# Patient Record
Sex: Male | Born: 1960
Health system: Southern US, Community
[De-identification: ages and names within clinical notes are randomized; demographics above are authoritative.]

## PROBLEM LIST (undated history)

## (undated) DIAGNOSIS — Z8614 Personal history of Methicillin resistant Staphylococcus aureus infection: Secondary | ICD-10-CM

## (undated) DIAGNOSIS — I739 Peripheral vascular disease, unspecified: Secondary | ICD-10-CM

## (undated) DIAGNOSIS — J189 Pneumonia, unspecified organism: Secondary | ICD-10-CM

## (undated) DIAGNOSIS — I10 Essential (primary) hypertension: Secondary | ICD-10-CM

## (undated) DIAGNOSIS — M1990 Unspecified osteoarthritis, unspecified site: Secondary | ICD-10-CM

## (undated) DIAGNOSIS — K219 Gastro-esophageal reflux disease without esophagitis: Secondary | ICD-10-CM

## (undated) HISTORY — PX: WRIST SURGERY: SHX841

## (undated) HISTORY — PX: FOOT SURGERY: SHX648

---

## 1986-08-09 HISTORY — PX: HAND FUSION: SHX975

## 2002-08-09 DIAGNOSIS — Z8614 Personal history of Methicillin resistant Staphylococcus aureus infection: Secondary | ICD-10-CM

## 2002-08-09 HISTORY — DX: Personal history of Methicillin resistant Staphylococcus aureus infection: Z86.14

## 2014-05-18 ENCOUNTER — Emergency Department (HOSPITAL_COMMUNITY): Payer: No Typology Code available for payment source

## 2014-05-18 ENCOUNTER — Encounter (HOSPITAL_COMMUNITY): Payer: Self-pay | Admitting: Emergency Medicine

## 2014-05-18 ENCOUNTER — Emergency Department (HOSPITAL_COMMUNITY)
Admission: EM | Admit: 2014-05-18 | Discharge: 2014-05-18 | Disposition: A | Discharge status: Home / Self Care | Payer: No Typology Code available for payment source | Attending: Emergency Medicine | Admitting: Emergency Medicine

## 2014-05-18 DIAGNOSIS — S39012A Strain of muscle, fascia and tendon of lower back, initial encounter: Secondary | ICD-10-CM | POA: Diagnosis not present

## 2014-05-18 DIAGNOSIS — S3982XA Other specified injuries of lower back, initial encounter: Secondary | ICD-10-CM | POA: Diagnosis present

## 2014-05-18 DIAGNOSIS — M87059 Idiopathic aseptic necrosis of unspecified femur: Secondary | ICD-10-CM | POA: Diagnosis not present

## 2014-05-18 DIAGNOSIS — Z79899 Other long term (current) drug therapy: Secondary | ICD-10-CM | POA: Diagnosis not present

## 2014-05-18 DIAGNOSIS — R197 Diarrhea, unspecified: Secondary | ICD-10-CM | POA: Diagnosis not present

## 2014-05-18 DIAGNOSIS — Y9389 Activity, other specified: Secondary | ICD-10-CM | POA: Insufficient documentation

## 2014-05-18 DIAGNOSIS — S3991XA Unspecified injury of abdomen, initial encounter: Secondary | ICD-10-CM | POA: Diagnosis not present

## 2014-05-18 DIAGNOSIS — Y92410 Unspecified street and highway as the place of occurrence of the external cause: Secondary | ICD-10-CM | POA: Diagnosis not present

## 2014-05-18 DIAGNOSIS — Z720 Tobacco use: Secondary | ICD-10-CM | POA: Diagnosis not present

## 2014-05-18 DIAGNOSIS — M87052 Idiopathic aseptic necrosis of left femur: Secondary | ICD-10-CM

## 2014-05-18 DIAGNOSIS — R109 Unspecified abdominal pain: Secondary | ICD-10-CM

## 2014-05-18 DIAGNOSIS — R10811 Right upper quadrant abdominal tenderness: Secondary | ICD-10-CM | POA: Insufficient documentation

## 2014-05-18 LAB — CBC WITH DIFFERENTIAL/PLATELET
Basophils Absolute: 0 10*3/uL (ref 0.0–0.1)
Basophils Relative: 0 % (ref 0–1)
Eosinophils Absolute: 0 10*3/uL (ref 0.0–0.7)
Eosinophils Relative: 0 % (ref 0–5)
HCT: 41 % (ref 39.0–52.0)
Hemoglobin: 14.2 g/dL (ref 13.0–17.0)
Lymphocytes Relative: 16 % (ref 12–46)
Lymphs Abs: 1.1 10*3/uL (ref 0.7–4.0)
MCH: 30.8 pg (ref 26.0–34.0)
MCHC: 34.6 g/dL (ref 30.0–36.0)
MCV: 88.9 fL (ref 78.0–100.0)
Monocytes Absolute: 0.7 10*3/uL (ref 0.1–1.0)
Monocytes Relative: 10 % (ref 3–12)
Neutro Abs: 5.1 10*3/uL (ref 1.7–7.7)
Neutrophils Relative %: 74 % (ref 43–77)
Platelets: 196 10*3/uL (ref 150–400)
RBC: 4.61 MIL/uL (ref 4.22–5.81)
RDW: 14.1 % (ref 11.5–15.5)
WBC: 6.9 10*3/uL (ref 4.0–10.5)

## 2014-05-18 LAB — BASIC METABOLIC PANEL
Anion gap: 14 (ref 5–15)
BUN: 11 mg/dL (ref 6–23)
CO2: 25 mEq/L (ref 19–32)
Calcium: 9.4 mg/dL (ref 8.4–10.5)
Chloride: 98 mEq/L (ref 96–112)
Creatinine, Ser: 0.95 mg/dL (ref 0.50–1.35)
GFR calc Af Amer: >90 mL/min (ref >90)
GFR calc non Af Amer: >90 mL/min (ref >90)
Glucose, Bld: 112 mg/dL — ABNORMAL HIGH (ref 70–99)
Potassium: 4.1 mEq/L (ref 3.7–5.3)
Sodium: 137 mEq/L (ref 137–147)

## 2014-05-18 MED ORDER — HYDROMORPHONE HCL 1 MG/ML IJ SOLN
1.0000 mg | Freq: Once | INTRAMUSCULAR | Status: AC
  Administered 2014-05-18: 1 mg via INTRAVENOUS
  Filled 2014-05-18: qty 1

## 2014-05-18 MED ORDER — HYDROCODONE-ACETAMINOPHEN 5-325 MG PO TABS: 1.0000 | ORAL_TABLET | Freq: Four times a day (QID) | ORAL | Status: DC | PRN

## 2014-05-18 MED ORDER — ONDANSETRON HCL 4 MG/2ML IJ SOLN
4.0000 mg | Freq: Once | INTRAMUSCULAR | Status: AC
  Administered 2014-05-18: 4 mg via INTRAVENOUS
  Filled 2014-05-18: qty 2

## 2014-05-18 MED ORDER — IOHEXOL 300 MG/ML  SOLN
100.0000 mL | Freq: Once | INTRAMUSCULAR | Status: AC | PRN
  Administered 2014-05-18: 100 mL via INTRAVENOUS

## 2014-05-18 MED ORDER — ONDANSETRON 4 MG PO TBDP: 4.0000 mg | ORAL_TABLET | Freq: Three times a day (TID) | ORAL | Status: DC | PRN

## 2014-05-18 MED ORDER — SODIUM CHLORIDE 0.9 % IV BOLUS (SEPSIS)
250.0000 mL | Freq: Once | INTRAVENOUS | Status: AC
  Administered 2014-05-18: 250 mL via INTRAVENOUS

## 2014-05-18 MED ORDER — SODIUM CHLORIDE 0.9 % IV SOLN
INTRAVENOUS | Status: DC
  Administered 2014-05-18: 100 mL/h via INTRAVENOUS
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
## 2014-05-18 NOTE — ED Notes (Signed)
Patient transported to CT 
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
## 2014-05-31 ENCOUNTER — Other Ambulatory Visit (HOSPITAL_COMMUNITY): Payer: Self-pay | Admitting: Orthopaedic Surgery

## 2014-06-04 ENCOUNTER — Encounter (HOSPITAL_COMMUNITY)
Admission: RE | Admit: 2014-06-04 | Discharge: 2014-06-04 | Disposition: A | Discharge status: Home / Self Care | Payer: No Typology Code available for payment source | Source: Ambulatory Visit | Attending: Orthopaedic Surgery | Admitting: Orthopaedic Surgery

## 2014-06-04 ENCOUNTER — Encounter (HOSPITAL_COMMUNITY): Payer: Self-pay

## 2014-06-04 HISTORY — DX: Unspecified osteoarthritis, unspecified site: M19.90

## 2014-06-04 HISTORY — DX: Personal history of Methicillin resistant Staphylococcus aureus infection: Z86.14

## 2014-06-04 LAB — URINE MICROSCOPIC-ADD ON

## 2014-06-04 LAB — CBC WITH DIFFERENTIAL/PLATELET
Basophils Absolute: 0 10*3/uL (ref 0.0–0.1)
Basophils Relative: 0 % (ref 0–1)
Eosinophils Absolute: 0.1 10*3/uL (ref 0.0–0.7)
Eosinophils Relative: 1 % (ref 0–5)
HCT: 47.7 % (ref 39.0–52.0)
Hemoglobin: 16 g/dL (ref 13.0–17.0)
Lymphocytes Relative: 22 % (ref 12–46)
Lymphs Abs: 1.5 10*3/uL (ref 0.7–4.0)
MCH: 30.9 pg (ref 26.0–34.0)
MCHC: 33.5 g/dL (ref 30.0–36.0)
MCV: 92.3 fL (ref 78.0–100.0)
Monocytes Absolute: 0.7 10*3/uL (ref 0.1–1.0)
Monocytes Relative: 10 % (ref 3–12)
Neutro Abs: 4.4 10*3/uL (ref 1.7–7.7)
Neutrophils Relative %: 67 % (ref 43–77)
Platelets: 297 10*3/uL (ref 150–400)
RBC: 5.17 MIL/uL (ref 4.22–5.81)
RDW: 14.2 % (ref 11.5–15.5)
WBC: 6.6 10*3/uL (ref 4.0–10.5)

## 2014-06-04 LAB — TYPE AND SCREEN
ABO/RH(D): O POS
Antibody Screen: NEGATIVE

## 2014-06-04 LAB — SURGICAL PCR SCREEN
MRSA, PCR: NEGATIVE
Staphylococcus aureus: NEGATIVE

## 2014-06-04 LAB — URINALYSIS, ROUTINE W REFLEX MICROSCOPIC
Bilirubin Urine: NEGATIVE
Glucose, UA: NEGATIVE mg/dL
Hgb urine dipstick: NEGATIVE
Ketones, ur: 15 mg/dL — AB
Nitrite: NEGATIVE
Protein, ur: NEGATIVE mg/dL
Specific Gravity, Urine: 1.03 (ref 1.005–1.030)
Urobilinogen, UA: 0.2 mg/dL (ref 0.0–1.0)
pH: 6.5 (ref 5.0–8.0)

## 2014-06-04 LAB — COMPREHENSIVE METABOLIC PANEL
ALT: 31 U/L (ref 0–53)
AST: 31 U/L (ref 0–37)
Albumin: 4.2 g/dL (ref 3.5–5.2)
Alkaline Phosphatase: 86 U/L (ref 39–117)
Anion gap: 16 — ABNORMAL HIGH (ref 5–15)
BUN: 11 mg/dL (ref 6–23)
CO2: 23 mEq/L (ref 19–32)
Calcium: 10 mg/dL (ref 8.4–10.5)
Chloride: 102 mEq/L (ref 96–112)
Creatinine, Ser: 0.84 mg/dL (ref 0.50–1.35)
GFR calc Af Amer: >90 mL/min (ref >90)
GFR calc non Af Amer: >90 mL/min (ref >90)
Glucose, Bld: 115 mg/dL — ABNORMAL HIGH (ref 70–99)
Potassium: 4.3 mEq/L (ref 3.7–5.3)
Sodium: 141 mEq/L (ref 137–147)
Total Bilirubin: 0.4 mg/dL (ref 0.3–1.2)
Total Protein: 8 g/dL (ref 6.0–8.3)

## 2014-06-04 LAB — PROTIME-INR
INR: 1.02 (ref 0.00–1.49)
Prothrombin Time: 13.5 seconds (ref 11.6–15.2)

## 2014-06-04 LAB — APTT: aPTT: 32 s (ref 24–37)

## 2014-06-04 LAB — ABO/RH: ABO/RH(D): O POS

## 2014-06-04 NOTE — Progress Notes (Signed)
Pt. States he doesn't have high blood pressure. Notified Dollene PrimroseAllison Zelanek, PA of blood pressure/pulse. Stated to get ekg. Pt. States last ekg was 2 yrs. Ago at Dr. Lanell MatarAronson's office.  Gave pt.  Emmi  Program  so he could watch it at home. Refused to watch it here.

## 2014-06-04 NOTE — Pre-Procedure Instructions (Signed)
Nathaniel [name]  06/04/2014   Your procedure is scheduled on:  Monday, Nov. 2  [facility] AM.  Call this number if you have problems the morning of surgery: [phone]                           Call this number if you have any questions prior to your surgery date [phone]  Remember:   Do not eat food or drink liquids after midnight.   Take these medicines the morning of surgery with A SIP OF WATER: wellbutrin, hydrocodone if needed, zofran if needed               Stop taking aspirin, vitamins, and herbal medications. Do not take any NSAIDS ie: ibuprofen, advil, naproxen or any medication containing aspirin: stop 5 days prior to procedure (thursday Oct. 29)                Do not wear jewelry, make-up or nail polish.  Do not wear lotions, powders, or perfumes. You may wear deodorant.  Do not shave 48 hours prior to surgery. Men may shave face and neck.  Do not bring valuables to the hospital.  [facility] is not responsible for any belongings or valuables.               Contacts, dentures or bridgework may not be worn into surgery.  Leave suitcase in the car. After surgery it may be brought to your room.  For patients admitted to the hospital, discharge time is determined by your treatment team.               Patients discharged the day of surgery will not be allowed to drive home.  Name and phone number of your driver:   Special Instructions: review preparing for surgery  handout   Please read over the following fact sheets that you were given: Pain Booklet, Coughing and Deep Breathing, Blood Transfusion Information, MRSA Information and Surgical Site Infection Prevention

## 2014-06-04 NOTE — Progress Notes (Signed)
06/04/14 1247  OBSTRUCTIVE SLEEP APNEA  Have you ever been diagnosed with sleep apnea through a sleep study? No  Do you snore loudly (loud enough to be heard through closed doors)?  1  Do you often feel tired, fatigued, or sleepy during the daytime? 1  Has anyone observed you stop breathing during your sleep? 0  Do you have, or are you being treated for high blood pressure? 0  BMI more than 35 kg/m2? 0  Age over 51 years old? 1  Neck circumference greater than 40 cm/16 inches? 1  Gender: 1  Obstructive Sleep Apnea Score 5  Score 4 or greater  Results sent to PCP

## 2014-06-05 LAB — URINE CULTURE
Colony Count: NO GROWTH
Culture: NO GROWTH

## 2014-06-05 NOTE — Progress Notes (Signed)
Anesthesia Chart Review:  Pt is 53 year old male scheduled for L total hip arthroplasty on 06/10/2014 with Dr. Roda ShuttersXu  PMH: arthritis. Current smoker. BMI 34  Medications include: ibuprofen, zofran, wellbutrin, tramadol, vicodin  BP at PAT 160/99, pulse 104  Preoperative labs reviewed.    EKG: Normal sinus rhythm Cannot rule out Anterior infarct , age undetermined Appears unchanged from previous in 2011.  If no changes, I anticipate pt can proceed with surgery as scheduled.   Rica Mastngela Jair Lindblad, FNP-BC [facility]iology Phone: [phone] 06/05/2014 2:22 PM

## 2014-06-09 MED ORDER — TRANEXAMIC ACID 100 MG/ML IV SOLN
1000.0000 mg | INTRAVENOUS | Status: DC
  Filled 2014-06-09: qty 10

## 2014-06-09 MED ORDER — CEFAZOLIN SODIUM-DEXTROSE 2-3 GM-% IV SOLR
2.0000 g | INTRAVENOUS | Status: AC
  Administered 2014-06-10: 2 g via INTRAVENOUS
  Filled 2014-06-09: qty 50

## 2014-06-09 MED ORDER — CHLORHEXIDINE GLUCONATE 4 % EX LIQD
60.0000 mL | Freq: Once | CUTANEOUS | Status: DC
  Filled 2014-06-09: qty 60

## 2014-06-10 ENCOUNTER — Encounter (HOSPITAL_COMMUNITY)
Admission: RE | Disposition: A | Discharge status: Home / Self Care | Payer: Self-pay | Source: Ambulatory Visit | Attending: Orthopaedic Surgery

## 2014-06-10 ENCOUNTER — Encounter (HOSPITAL_COMMUNITY): Payer: Self-pay | Admitting: *Deleted

## 2014-06-10 ENCOUNTER — Inpatient Hospital Stay (HOSPITAL_COMMUNITY)
Admission: RE | Admit: 2014-06-10 | Discharge: 2014-06-12 | DRG: 470 | Disposition: A | Discharge status: Home / Self Care | Payer: No Typology Code available for payment source | Source: Ambulatory Visit | Attending: Orthopaedic Surgery | Admitting: Orthopaedic Surgery

## 2014-06-10 ENCOUNTER — Inpatient Hospital Stay (HOSPITAL_COMMUNITY): Payer: No Typology Code available for payment source

## 2014-06-10 ENCOUNTER — Inpatient Hospital Stay (HOSPITAL_COMMUNITY): Payer: No Typology Code available for payment source | Admitting: Vascular Surgery

## 2014-06-10 ENCOUNTER — Inpatient Hospital Stay (HOSPITAL_COMMUNITY): Payer: No Typology Code available for payment source | Admitting: Anesthesiology

## 2014-06-10 DIAGNOSIS — M87852 Other osteonecrosis, left femur: Principal | ICD-10-CM | POA: Diagnosis present

## 2014-06-10 DIAGNOSIS — Z419 Encounter for procedure for purposes other than remedying health state, unspecified: Secondary | ICD-10-CM

## 2014-06-10 DIAGNOSIS — M1612 Unilateral primary osteoarthritis, left hip: Secondary | ICD-10-CM | POA: Diagnosis present

## 2014-06-10 DIAGNOSIS — Z8614 Personal history of Methicillin resistant Staphylococcus aureus infection: Secondary | ICD-10-CM | POA: Diagnosis not present

## 2014-06-10 DIAGNOSIS — F17210 Nicotine dependence, cigarettes, uncomplicated: Secondary | ICD-10-CM | POA: Diagnosis present

## 2014-06-10 DIAGNOSIS — M879 Osteonecrosis, unspecified: Secondary | ICD-10-CM | POA: Diagnosis present

## 2014-06-10 DIAGNOSIS — Z9889 Other specified postprocedural states: Secondary | ICD-10-CM

## 2014-06-10 DIAGNOSIS — D62 Acute posthemorrhagic anemia: Secondary | ICD-10-CM | POA: Diagnosis not present

## 2014-06-10 DIAGNOSIS — Z96649 Presence of unspecified artificial hip joint: Secondary | ICD-10-CM

## 2014-06-10 HISTORY — PX: TOTAL HIP ARTHROPLASTY: SHX124

## 2014-06-10 SURGERY — ARTHROPLASTY, HIP, TOTAL, ANTERIOR APPROACH
Anesthesia: General | Laterality: Left

## 2014-06-10 MED ORDER — ACETAMINOPHEN 500 MG PO TABS
1000.0000 mg | ORAL_TABLET | Freq: Four times a day (QID) | ORAL | Status: AC
  Administered 2014-06-10 – 2014-06-11 (×4): 1000 mg via ORAL
  Filled 2014-06-10 (×6): qty 2

## 2014-06-10 MED ORDER — ONDANSETRON HCL 4 MG/2ML IJ SOLN
INTRAMUSCULAR | Status: AC
  Filled 2014-06-10: qty 2

## 2014-06-10 MED ORDER — HYDROMORPHONE HCL 1 MG/ML IJ SOLN
INTRAMUSCULAR | Status: AC
  Filled 2014-06-10: qty 1

## 2014-06-10 MED ORDER — OXYCODONE HCL ER 10 MG PO T12A
10.0000 mg | EXTENDED_RELEASE_TABLET | Freq: Two times a day (BID) | ORAL | Status: DC
  Administered 2014-06-10 – 2014-06-12 (×4): 10 mg via ORAL
  Filled 2014-06-10 (×6): qty 1

## 2014-06-10 MED ORDER — SODIUM CHLORIDE 0.9 % IV SOLN
INTRAVENOUS | Status: DC
  Administered 2014-06-10: 21:00:00 via INTRAVENOUS

## 2014-06-10 MED ORDER — HYDROMORPHONE HCL 1 MG/ML IJ SOLN
INTRAMUSCULAR | Status: AC
  Administered 2014-06-10: 0.5 mg via INTRAVENOUS
  Filled 2014-06-10: qty 1

## 2014-06-10 MED ORDER — LIDOCAINE HCL (CARDIAC) 20 MG/ML IV SOLN
INTRAVENOUS | Status: DC | PRN
  Administered 2014-06-10: 100 mg via INTRAVENOUS

## 2014-06-10 MED ORDER — PHENOL 1.4 % MT LIQD: 1.0000 | OROMUCOSAL | Status: DC | PRN

## 2014-06-10 MED ORDER — ONDANSETRON HCL 4 MG/2ML IJ SOLN: 4.0000 mg | Freq: Once | INTRAMUSCULAR | Status: DC | PRN

## 2014-06-10 MED ORDER — KETOROLAC TROMETHAMINE 30 MG/ML IJ SOLN
30.0000 mg | Freq: Four times a day (QID) | INTRAMUSCULAR | Status: DC | PRN
  Administered 2014-06-10: 30 mg via INTRAVENOUS

## 2014-06-10 MED ORDER — LABETALOL HCL 5 MG/ML IV SOLN
INTRAVENOUS | Status: DC | PRN
  Administered 2014-06-10 (×3): 5 mg via INTRAVENOUS

## 2014-06-10 MED ORDER — ACETAMINOPHEN 325 MG PO TABS: 650.0000 mg | ORAL_TABLET | Freq: Four times a day (QID) | ORAL | Status: DC | PRN

## 2014-06-10 MED ORDER — MEPERIDINE HCL 25 MG/ML IJ SOLN: 6.2500 mg | INTRAMUSCULAR | Status: DC | PRN

## 2014-06-10 MED ORDER — DEXTROSE 5 % IV SOLN
500.0000 mg | Freq: Four times a day (QID) | INTRAVENOUS | Status: DC | PRN
  Filled 2014-06-10: qty 5

## 2014-06-10 MED ORDER — ROCURONIUM BROMIDE 50 MG/5ML IV SOLN
INTRAVENOUS | Status: AC
  Filled 2014-06-10: qty 2

## 2014-06-10 MED ORDER — CEFAZOLIN SODIUM-DEXTROSE 2-3 GM-% IV SOLR
2.0000 g | Freq: Four times a day (QID) | INTRAVENOUS | Status: AC
  Administered 2014-06-10 – 2014-06-11 (×2): 2 g via INTRAVENOUS
  Filled 2014-06-10 (×2): qty 50

## 2014-06-10 MED ORDER — OXYCODONE HCL 5 MG PO TABS: 5.0000 mg | ORAL_TABLET | ORAL | Status: DC | PRN

## 2014-06-10 MED ORDER — OXYCODONE HCL 5 MG/5ML PO SOLN: 5.0000 mg | Freq: Once | ORAL | Status: DC | PRN

## 2014-06-10 MED ORDER — SODIUM CHLORIDE 0.9 % IR SOLN
Status: DC | PRN
  Administered 2014-06-10: 1000 mL

## 2014-06-10 MED ORDER — OXYCODONE HCL 5 MG PO TABS
5.0000 mg | ORAL_TABLET | ORAL | Status: DC | PRN
  Administered 2014-06-10 – 2014-06-11 (×5): 10 mg via ORAL
  Filled 2014-06-10 (×4): qty 2

## 2014-06-10 MED ORDER — FENTANYL CITRATE 0.05 MG/ML IJ SOLN
INTRAMUSCULAR | Status: DC | PRN
  Administered 2014-06-10: 50 ug via INTRAVENOUS
  Administered 2014-06-10: 100 ug via INTRAVENOUS
  Administered 2014-06-10: 50 ug via INTRAVENOUS
  Administered 2014-06-10 (×2): 100 ug via INTRAVENOUS
  Administered 2014-06-10 (×2): 50 ug via INTRAVENOUS

## 2014-06-10 MED ORDER — KETOROLAC TROMETHAMINE 30 MG/ML IJ SOLN
INTRAMUSCULAR | Status: AC
  Filled 2014-06-10: qty 1

## 2014-06-10 MED ORDER — DIPHENHYDRAMINE HCL 12.5 MG/5ML PO ELIX: 25.0000 mg | ORAL_SOLUTION | ORAL | Status: DC | PRN

## 2014-06-10 MED ORDER — OXYCODONE HCL 5 MG PO TABS
ORAL_TABLET | ORAL | Status: AC
  Filled 2014-06-10: qty 2

## 2014-06-10 MED ORDER — NEOSTIGMINE METHYLSULFATE 10 MG/10ML IV SOLN
INTRAVENOUS | Status: DC | PRN
  Administered 2014-06-10: 2 mg via INTRAVENOUS
  Administered 2014-06-10: 3 mg via INTRAVENOUS

## 2014-06-10 MED ORDER — BUPROPION HCL ER (XL) 150 MG PO TB24
150.0000 mg | ORAL_TABLET | Freq: Every day | ORAL | Status: DC
  Administered 2014-06-10 – 2014-06-11 (×2): 150 mg via ORAL
  Filled 2014-06-10 (×3): qty 1

## 2014-06-10 MED ORDER — SENNOSIDES-DOCUSATE SODIUM 8.6-50 MG PO TABS: 1.0000 | ORAL_TABLET | Freq: Every evening | ORAL | Status: DC | PRN

## 2014-06-10 MED ORDER — METOCLOPRAMIDE HCL 10 MG PO TABS: 5.0000 mg | ORAL_TABLET | Freq: Three times a day (TID) | ORAL | Status: DC | PRN

## 2014-06-10 MED ORDER — METHOCARBAMOL 500 MG PO TABS
ORAL_TABLET | ORAL | Status: AC
  Administered 2014-06-10: 500 mg via ORAL
  Filled 2014-06-10: qty 1

## 2014-06-10 MED ORDER — ACETAMINOPHEN 650 MG RE SUPP: 650.0000 mg | Freq: Four times a day (QID) | RECTAL | Status: DC | PRN

## 2014-06-10 MED ORDER — ALUM & MAG HYDROXIDE-SIMETH 200-200-20 MG/5ML PO SUSP
30.0000 mL | ORAL | Status: DC | PRN
  Administered 2014-06-10: 30 mL via ORAL
  Filled 2014-06-10: qty 30

## 2014-06-10 MED ORDER — TRANEXAMIC ACID 100 MG/ML IV SOLN
1000.0000 mg | INTRAVENOUS | Status: AC
  Administered 2014-06-10: 1000 mg via INTRAVENOUS
  Filled 2014-06-10: qty 10

## 2014-06-10 MED ORDER — MIDAZOLAM HCL 5 MG/5ML IJ SOLN
INTRAMUSCULAR | Status: DC | PRN
  Administered 2014-06-10 (×2): 1 mg via INTRAVENOUS

## 2014-06-10 MED ORDER — 0.9 % SODIUM CHLORIDE (POUR BTL) OPTIME
TOPICAL | Status: DC | PRN
  Administered 2014-06-10: 1000 mL

## 2014-06-10 MED ORDER — ROCURONIUM BROMIDE 100 MG/10ML IV SOLN
INTRAVENOUS | Status: DC | PRN
  Administered 2014-06-10: 10 mg via INTRAVENOUS
  Administered 2014-06-10: 20 mg via INTRAVENOUS
  Administered 2014-06-10: 50 mg via INTRAVENOUS

## 2014-06-10 MED ORDER — DEXTROSE 5 % IV SOLN
INTRAVENOUS | Status: DC | PRN
  Administered 2014-06-10: 13:00:00 via INTRAVENOUS

## 2014-06-10 MED ORDER — OXYCODONE HCL ER 10 MG PO T12A: 10.0000 mg | EXTENDED_RELEASE_TABLET | Freq: Two times a day (BID) | ORAL | Status: DC

## 2014-06-10 MED ORDER — ARTIFICIAL TEARS OP OINT
TOPICAL_OINTMENT | OPHTHALMIC | Status: AC
  Filled 2014-06-10: qty 3.5

## 2014-06-10 MED ORDER — GLYCOPYRROLATE 0.2 MG/ML IJ SOLN
INTRAMUSCULAR | Status: AC
  Filled 2014-06-10: qty 4

## 2014-06-10 MED ORDER — OXYCODONE HCL 5 MG PO TABS: 5.0000 mg | ORAL_TABLET | Freq: Once | ORAL | Status: DC | PRN

## 2014-06-10 MED ORDER — METOCLOPRAMIDE HCL 5 MG/ML IJ SOLN: 5.0000 mg | Freq: Three times a day (TID) | INTRAMUSCULAR | Status: DC | PRN

## 2014-06-10 MED ORDER — HYDROMORPHONE HCL 1 MG/ML IJ SOLN
0.2500 mg | INTRAMUSCULAR | Status: DC | PRN
  Administered 2014-06-10 (×6): 0.5 mg via INTRAVENOUS

## 2014-06-10 MED ORDER — GLYCOPYRROLATE 0.2 MG/ML IJ SOLN
INTRAMUSCULAR | Status: DC | PRN
Start: 2014-06-10 — End: 2014-06-10
  Administered 2014-06-10 (×2): 0.4 mg via INTRAVENOUS

## 2014-06-10 MED ORDER — LIDOCAINE HCL (CARDIAC) 20 MG/ML IV SOLN
INTRAVENOUS | Status: AC
  Filled 2014-06-10: qty 5

## 2014-06-10 MED ORDER — ONDANSETRON HCL 4 MG PO TABS: 4.0000 mg | ORAL_TABLET | Freq: Four times a day (QID) | ORAL | Status: DC | PRN

## 2014-06-10 MED ORDER — CELECOXIB 200 MG PO CAPS
200.0000 mg | ORAL_CAPSULE | Freq: Two times a day (BID) | ORAL | Status: DC
  Administered 2014-06-10 – 2014-06-11 (×3): 200 mg via ORAL
  Filled 2014-06-10 (×5): qty 1

## 2014-06-10 MED ORDER — MIDAZOLAM HCL 2 MG/2ML IJ SOLN
INTRAMUSCULAR | Status: AC
  Filled 2014-06-10: qty 2

## 2014-06-10 MED ORDER — LACTATED RINGERS IV SOLN
INTRAVENOUS | Status: DC | PRN
  Administered 2014-06-10 (×3): via INTRAVENOUS

## 2014-06-10 MED ORDER — ONDANSETRON HCL 4 MG/2ML IJ SOLN: 4.0000 mg | Freq: Four times a day (QID) | INTRAMUSCULAR | Status: DC | PRN

## 2014-06-10 MED ORDER — METHOCARBAMOL 500 MG PO TABS
500.0000 mg | ORAL_TABLET | Freq: Four times a day (QID) | ORAL | Status: DC | PRN
  Administered 2014-06-10: 500 mg via ORAL

## 2014-06-10 MED ORDER — ONDANSETRON HCL 4 MG/2ML IJ SOLN
INTRAMUSCULAR | Status: DC | PRN
  Administered 2014-06-10: 4 mg via INTRAVENOUS

## 2014-06-10 MED ORDER — ASPIRIN EC 325 MG PO TBEC
325.0000 mg | DELAYED_RELEASE_TABLET | Freq: Two times a day (BID) | ORAL | Status: DC
  Administered 2014-06-10 – 2014-06-12 (×4): 325 mg via ORAL
  Filled 2014-06-10 (×7): qty 1

## 2014-06-10 MED ORDER — HYDROMORPHONE HCL 1 MG/ML IJ SOLN: 0.2500 mg | INTRAMUSCULAR | Status: DC | PRN

## 2014-06-10 MED ORDER — PROPOFOL 10 MG/ML IV BOLUS
INTRAVENOUS | Status: DC | PRN
  Administered 2014-06-10: 160 mg via INTRAVENOUS
  Administered 2014-06-10: 30 mg via INTRAVENOUS

## 2014-06-10 MED ORDER — ASPIRIN EC 325 MG PO TBEC: 325.0000 mg | DELAYED_RELEASE_TABLET | Freq: Two times a day (BID) | ORAL | Status: DC

## 2014-06-10 MED ORDER — ALBUMIN HUMAN 5 % IV SOLN
INTRAVENOUS | Status: DC | PRN
  Administered 2014-06-10: 15:00:00 via INTRAVENOUS

## 2014-06-10 MED ORDER — SENNA 8.6 MG PO TABS
1.0000 | ORAL_TABLET | Freq: Two times a day (BID) | ORAL | Status: DC
  Administered 2014-06-10 – 2014-06-11 (×3): 8.6 mg via ORAL
  Filled 2014-06-10 (×5): qty 1

## 2014-06-10 MED ORDER — POLYETHYLENE GLYCOL 3350 17 G PO PACK: 17.0000 g | PACK | Freq: Every day | ORAL | Status: DC | PRN

## 2014-06-10 MED ORDER — MAGNESIUM CITRATE PO SOLN: 1.0000 | Freq: Once | ORAL | Status: AC | PRN

## 2014-06-10 MED ORDER — SORBITOL 70 % SOLN: 30.0000 mL | Freq: Every day | Status: DC | PRN

## 2014-06-10 MED ORDER — PROMETHAZINE HCL 25 MG PO TABS: 25.0000 mg | ORAL_TABLET | Freq: Four times a day (QID) | ORAL | Status: DC | PRN

## 2014-06-10 MED ORDER — MENTHOL 3 MG MT LOZG: 1.0000 | LOZENGE | OROMUCOSAL | Status: DC | PRN

## 2014-06-10 MED ORDER — PROPOFOL 10 MG/ML IV BOLUS
INTRAVENOUS | Status: AC
  Filled 2014-06-10: qty 20

## 2014-06-10 MED ORDER — FENTANYL CITRATE 0.05 MG/ML IJ SOLN
INTRAMUSCULAR | Status: AC
  Filled 2014-06-10: qty 5

## 2014-06-10 MED ORDER — MORPHINE SULFATE 2 MG/ML IJ SOLN
2.0000 mg | INTRAMUSCULAR | Status: DC | PRN
  Administered 2014-06-10: 2 mg via INTRAVENOUS
  Filled 2014-06-10: qty 1

## 2014-06-10 MED ORDER — LACTATED RINGERS IV SOLN
INTRAVENOUS | Status: DC
  Administered 2014-06-10: 11:00:00 via INTRAVENOUS

## 2014-06-10 SURGICAL SUPPLY — 51 items
BIT DRILL FLEXIBLE 35 (BIT) ×1
BIT DRILL FLEXIBLE 35MM (BIT) ×1 IMPLANT
BLADE SAW SGTL 18X1.27X75 (BLADE) ×2 IMPLANT
CELLS DAT CNTRL 66122 CELL SVR (MISCELLANEOUS) ×2 IMPLANT
DERMABOND ADVANCED (GAUZE/BANDAGES/DRESSINGS)
DERMABOND ADVANCED .7 DNX12 (GAUZE/BANDAGES/DRESSINGS) IMPLANT
DRAPE C-ARM 42X72 X-RAY (DRAPES) ×2 IMPLANT
DRAPE IMP U-DRAPE 54X76 (DRAPES) ×2 IMPLANT
DRAPE STERI IOBAN 125X83 (DRAPES) ×2 IMPLANT
DRAPE SURG 17X11 SM STRL (DRAPES) ×4 IMPLANT
DRAPE U-SHAPE 47X51 STRL (DRAPES) ×6 IMPLANT
DRILL BIT FLEXIBLE 35MM (BIT) ×1
DRSG AQUACEL AG ADV 3.5X10 (GAUZE/BANDAGES/DRESSINGS) ×2 IMPLANT
DURAPREP 26ML APPLICATOR (WOUND CARE) ×2 IMPLANT
ELECT BLADE 4.0 EZ CLEAN MEGAD (MISCELLANEOUS) ×2
ELECT BLADE TIP CTD 4 INCH (ELECTRODE) IMPLANT
ELECT REM PT RETURN 9FT ADLT (ELECTROSURGICAL) ×2
ELECTRODE BLDE 4.0 EZ CLN MEGD (MISCELLANEOUS) ×1 IMPLANT
ELECTRODE REM PT RTRN 9FT ADLT (ELECTROSURGICAL) ×1 IMPLANT
FACESHIELD WRAPAROUND (MASK) ×4 IMPLANT
GAUZE XEROFORM 5X9 LF (GAUZE/BANDAGES/DRESSINGS) ×2 IMPLANT
GLOVE BIO SURGEON STRL SZ 6.5 (GLOVE) ×2 IMPLANT
GLOVE BIOGEL PI IND STRL 6 (GLOVE) ×1 IMPLANT
GLOVE BIOGEL PI IND STRL 6.5 (GLOVE) ×2 IMPLANT
GLOVE BIOGEL PI INDICATOR 6 (GLOVE) ×1
GLOVE BIOGEL PI INDICATOR 6.5 (GLOVE) ×2
GLOVE ECLIPSE 7.0 STRL STRAW (GLOVE) ×6 IMPLANT
GLOVE SURG SYN 7.5  E (GLOVE) ×2
GLOVE SURG SYN 7.5 E (GLOVE) ×2 IMPLANT
GOWN STRL REUS W/TWL XL LVL3 (GOWN DISPOSABLE) ×4 IMPLANT
HANDPIECE INTERPULSE COAX TIP (DISPOSABLE) ×1
HIP FOROUS FEM W/OX HD RS XLPE ×2 IMPLANT
KIT BASIN OR (CUSTOM PROCEDURE TRAY) ×2 IMPLANT
PACK TOTAL JOINT (CUSTOM PROCEDURE TRAY) ×2 IMPLANT
PACK UNIVERSAL I (CUSTOM PROCEDURE TRAY) ×2 IMPLANT
PADDING CAST COTTON 6X4 STRL (CAST SUPPLIES) IMPLANT
PEN SKIN MARKING BROAD (MISCELLANEOUS) ×2 IMPLANT
RTRCTR WOUND ALEXIS 18CM MED (MISCELLANEOUS) ×4
SET HNDPC FAN SPRY TIP SCT (DISPOSABLE) ×1 IMPLANT
STRIP CLOSURE SKIN 1/2X4 (GAUZE/BANDAGES/DRESSINGS) IMPLANT
SUT MNCRL AB 3-0 PS2 18 (SUTURE) ×2 IMPLANT
SUT VIC AB 0 CT1 27 (SUTURE) ×1
SUT VIC AB 0 CT1 27XBRD ANBCTR (SUTURE) ×1 IMPLANT
SUT VIC AB 1 CT1 27 (SUTURE) ×1
SUT VIC AB 1 CT1 27XBRD ANBCTR (SUTURE) ×1 IMPLANT
SUT VIC AB 2-0 CT1 27 (SUTURE) ×2
SUT VIC AB 2-0 CT1 TAPERPNT 27 (SUTURE) ×2 IMPLANT
TOWEL OR 17X26 10 PK STRL BLUE (TOWEL DISPOSABLE) ×4 IMPLANT
TOWEL OR NON WOVEN STRL DISP B (DISPOSABLE) IMPLANT
TRAY FOLEY CATH 16FR SILVER (SET/KITS/TRAYS/PACK) ×2 IMPLANT
TUBE SUCT ARGYLE STRL (TUBING) ×2 IMPLANT

## 2014-06-10 NOTE — Anesthesia Procedure Notes (Signed)
Procedure Name: Intubation Date/Time: 06/10/2014 12:39 PM Performed by: Darcey NoraJAMES, Anjelina Dung B Pre-anesthesia Checklist: Patient identified, Emergency Drugs available, Suction available and Patient being monitored Patient Re-evaluated:Patient Re-evaluated prior to inductionOxygen Delivery Method: Circle system utilized Preoxygenation: Pre-oxygenation with 100% oxygen Intubation Type: IV induction Ventilation: Mask ventilation without difficulty Laryngoscope Size: Mac and 4 Grade View: Grade II Tube type: Oral Tube size: 7.5 mm Number of attempts: 1 Airway Equipment and Method: Stylet Placement Confirmation: ETT inserted through vocal cords under direct vision,  breath sounds checked- equal and bilateral and positive ETCO2 Secured at: 21 (cm at teeth) cm Tube secured with: Tape Dental Injury: Teeth and Oropharynx as per pre-operative assessment

## 2014-06-10 NOTE — Progress Notes (Signed)
Arthritis left hip

## 2014-06-10 NOTE — Anesthesia Preprocedure Evaluation (Addendum)
Anesthesia Evaluation  Patient identified by MRN, date of birth, ID band Patient awake    Reviewed: Allergy & Precautions, H&P , NPO status , Patient's Chart, lab work & pertinent test results  Airway Mallampati: II  TM Distance: >3 FB Neck ROM: Full    Dental  (+) Teeth Intact, Dental Advisory Given   Pulmonary Current Smoker,  E-cigarettes and cigars         Cardiovascular     Neuro/Psych    GI/Hepatic   Endo/Other    Renal/GU      Musculoskeletal   Abdominal   Peds  Hematology   Anesthesia Other Findings Beard  Reproductive/Obstetrics                          Anesthesia Physical Anesthesia Plan  ASA: II  Anesthesia Plan: General   Post-op Pain Management:    Induction: Intravenous  Airway Management Planned: Oral ETT  Additional Equipment:   Intra-op Plan:   Post-operative Plan: Extubation in OR  Informed Consent: I have reviewed the patients History and Physical, chart, labs and discussed the procedure including the risks, benefits and alternatives for the proposed anesthesia with the patient or authorized representative who has indicated his/her understanding and acceptance.     Plan Discussed with: CRNA and Surgeon  Anesthesia Plan Comments:        Anesthesia Quick Evaluation

## 2014-06-10 NOTE — Progress Notes (Signed)
Foley catheter

## 2014-06-10 NOTE — Anesthesia Postprocedure Evaluation (Signed)
Anesthesia Post Note  Patient: Nathaniel [name]  Procedure(s) Performed: Procedure(s) (LRB): LEFT TOTAL HIP ARTHROPLASTY ANTERIOR APPROACH (Left)  Anesthesia type: general  Patient location: PACU  Post pain: Pain level controlled  Post assessment: Patient's Cardiovascular Status Stable  Last Vitals:  Filed Vitals:   06/10/14 1700  BP: 136/89  Pulse: 81  Temp: 36.3 C  Resp: 18    Post vital signs: Reviewed and stable  Level of consciousness: sedated  Complications: No apparent anesthesia complications

## 2014-06-10 NOTE — H&P (Signed)
PREOPERATIVE H&P  Chief Complaint: Left hip osteoarthritis, avascular necrosis  HPI: Nathaniel [name] is a 53 y.o. male who presents for surgical treatment of Left hip osteoarthritis, avascular necrosis.  He denies any changes in medical history.  Past Medical History  Diagnosis Date  . Arthritis   . Hx MRSA infection 2004    in the  foot   Past Surgical History  Procedure Laterality Date  . Foot surgery    . Wrist surgery    . Hand fusion Right 1988  . Foot surgery Right     has a steele rod   History   Social History  . Marital Status: Single    Spouse Name: N/A    Number of Children: N/A  . Years of Education: N/A   Social History Main Topics  . Smoking status: Current Every Day Smoker -- 20 years    Types: E-cigarettes  . Smokeless tobacco: Not on file  . Alcohol Use: No  . Drug Use: No  . Sexual Activity: Not on file   Other Topics Concern  . Not on file   Social History Narrative  . No narrative on file   No family history on file. No Known Allergies Prior to Admission medications   Medication Sig Start Date End Date Taking? Authorizing Provider  Ascorbic Acid (VITAMIN C PO) Take 1 tablet by mouth every morning.   Yes Historical Provider, MD  buPROPion (WELLBUTRIN XL) 150 MG 24 hr tablet Take 150 mg by mouth daily.  05/01/14  Yes Historical Provider, MD  Cholecalciferol (VITAMIN D PO) Take 1 capsule by mouth every morning.   Yes Historical Provider, MD  Cyanocobalamin (VITAMIN B 12 PO) Take 1 tablet by mouth every morning.   Yes Historical Provider, MD  HYDROcodone-acetaminophen (NORCO/VICODIN) 5-325 MG per tablet Take 1-2 tablets by mouth every 6 (six) hours as needed for moderate pain. 05/18/14  Yes Vanetta MuldersScott Zackowski, MD  ibuprofen (ADVIL,MOTRIN) 200 MG tablet Take 600 mg by mouth every 6 (six) hours as needed.   Yes Historical Provider, MD  MAGNESIUM PO Take 1 tablet by mouth every morning.   Yes Historical Provider, MD  ondansetron (ZOFRAN ODT) 4 MG  disintegrating tablet Take 1 tablet (4 mg total) by mouth every 8 (eight) hours as needed for nausea or vomiting. 05/18/14  Yes Vanetta MuldersScott Zackowski, MD  OVER THE COUNTER MEDICATION Place 1 application into the nose daily as needed (Nasal congestion). Essential oils   Yes Historical Provider, MD  peppermint oil liquid Take 1 application by mouth daily. Apply once to tongue each day   Yes Historical Provider, MD  traMADol (ULTRAM) 50 MG tablet Take 50 mg by mouth every 8 (eight) hours as needed.    Historical Provider, MD     Positive ROS: All other systems have been reviewed and were otherwise negative with the exception of those mentioned in the HPI and as above.  Physical Exam: General: Alert, no acute distress Cardiovascular: No pedal edema Respiratory: No cyanosis, no use of accessory musculature GI: abdomen soft Skin: No lesions in the area of chief complaint Neurologic: Sensation intact distally Psychiatric: Patient is competent for consent with normal mood and affect Lymphatic: no lymphedema  MUSCULOSKELETAL:  - no skin lesions - NVI LLE  Assessment: Left hip osteoarthritis, avascular necrosis  Plan: Plan for Procedure(s): LEFT TOTAL HIP ARTHROPLASTY ANTERIOR APPROACH  The risks benefits and alternatives were discussed with the patient including but not limited to the risks of nonoperative treatment, versus surgical  intervention including infection, bleeding, nerve injury,  blood clots, cardiopulmonary complications, morbidity, mortality, among others, and they were willing to proceed.   Cheral AlmasXu, Robertine Kipper Michael, MD   06/10/2014 8:27 AM

## 2014-06-10 NOTE — Progress Notes (Signed)
Able to feel pedal pulse very slightly in left foot

## 2014-06-10 NOTE — Transfer of Care (Signed)
Immediate Anesthesia Transfer of Care Note  Patient: Nathaniel [name]  Procedure(s) Performed: Procedure(s): LEFT TOTAL HIP ARTHROPLASTY ANTERIOR APPROACH (Left)  Patient Location: PACU  Anesthesia Type:General  Level of Consciousness: awake, alert  and patient cooperative  Airway & Oxygen Therapy: Patient Spontanous Breathing and Patient connected to nasal cannula oxygen  Post-op Assessment: Report given to PACU RN, Post -op Vital signs reviewed and stable and Patient moving all extremities  Post vital signs: Reviewed and stable  Complications: No apparent anesthesia complications

## 2014-06-10 NOTE — Discharge Instructions (Signed)
1. Remove dressing on 06/17/2014.  2. Begin betadine paints to incision twice a day with dry dressing. 3. May get incision wet while showering after staples have been removed.  Do not submerge incision until notified. 4. WBAT 5. Ice the operative 20 minutes on and 20 minutes off around the clock.  6. Avoid any dental visits including dental cleanings for 6 months after surgery.

## 2014-06-10 NOTE — Progress Notes (Signed)
Over the bed trapeze ordered

## 2014-06-10 NOTE — Op Note (Addendum)
LEFT TOTAL HIP ARTHROPLASTY ANTERIOR APPROACH  Procedure Note Franne FortsJames Schwer   [id number]  Pre-op Diagnosis: Left hip osteoarthritis, avascular necrosis     Post-op Diagnosis: same   Operative Procedures  1. Total hip replacement; Left hip; uncemented [id number]   Personnel  Surgeon(s): Zachory Mangual Glee ArvinMichael Faust Thorington, MD   Assistant: Maud DeedSheila Vernon, PA-C (necessary for the timely completion of the procedure)   Anesthesia: spinal, epidural   Prosthesis: Smith and Nephew Acetabulum: R3 Femur: Anthology 11 HO Head: 36 mm size: +8 Bearing Type: Oxinium on poly  Date of Service: 06/10/2014  Total Hip Arthroplasty (Anterior Approach) Op Note:  After informed consent was obtained and the operative extremity marked in the holding area, the patient was brought back to the operating room and placed supine on the HANA table. Next, the operative extremity was prepped and draped in normal sterile fashion. Surgical timeout occurred verifying patient identification, surgical site, surgical procedure and administration of antibiotics.  A modified anterior Smith-Peterson approach to the hip was performed, using the interval between tensor fascia lata and sartorius.  Dissection was carried bluntly down onto the anterior hip capsule. The lateral femoral circumflex vessels were identified and coagulated. A capsulotomy was performed and the capsular flaps tagged for later repair.  Fluoroscopy was utilized to prepare for the femoral neck cut. The neck osteotomy was performed. The femoral head was removed, the acetabular rim was cleared of soft tissue and attention was turned to reaming the acetabulum.  Sequential reaming was performed under fluoroscopic guidance. We reamed to a size 58 mm, and then impacted the acetabular shell. One 30 mm cancellous screw was placed through the cup.  The liner was then placed after irrigation and attention turned to the femur.  After placing the femoral hook, the leg was taken to externally  rotated, extended and adducted position taking care to perform soft tissue releases to allow for adequate mobilization of the femur. Soft tissue was cleared from the shoulder of the greater trochanter and the hook elevator used to improve exposure of the proximal femur. Sequential broaching performed up to a size 11. Trial neck and head were placed. The leg was brought back up to neutral and the construct reduced. The position and sizing of components, offset and leg lengths were checked using fluoroscopy. Stability of the  construct was checked in extension and external rotation without any subluxation or impingement of prosthesis. We dislocated the prosthesis, dropped the leg back into position, removed trial components, and irrigated copiously. The final stem and head was then placed, the leg brought back up, the system reduced and fluoroscopy used to verify positioning.  We irrigated, obtained hemostasis and closed the capsule using #5 ethibond suture.  Dilute betadyne solution was used. The fascia was closed with #1 vicryl plus, the deep fat layer was closed with 0 vicryl, the subcutaneous layers closed with 2.0 Vicryl Plus and the skin closed with staples. A sterile dressing was applied. The patient was awakened in the operating room and taken to recovery in stable condition. All sponge, needle, and instrument counts were correct at the end of the  case.   Position: supine  Complications: none.  Time Out: performed   Drains/Packing: none  Estimated blood loss: 300 cc  Returned to Recovery Room: in good condition.   Antibiotics: yes   Mechanical VTE (DVT) Prophylaxis: sequential compression devices, TED thigh-high  Chemical VTE (DVT) Prophylaxis: aspirin (other pharmacologic prophylaxis not  indicated - bleeding risk)   Fluid Replacement  Crystalloid:  2000 cc Blood: none  FFP: none   Specimens Removed: 1 to pathology   Sponge and Instrument Count Correct? yes   PACU: portable  radiograph - low AP   Admission: inpatient status, start PT & OT POD#1  Plan/RTC: Return in 2 weeks for staple removal. Return in 6 weeks to see MD.  Weight Bearing/Load Lower Extremity: full  Hip precautions: none Suture Removal: 10-14 days  Betadine to incision twice daily once dressing is removed on POD#7  N. Glee ArvinMichael Kinser Fellman, MD [facility] [phone] 3:27 PM      Implant Name Type Inv. Item Serial No. Manufacturer Lot No. LRB No. Used  30MM REFLECTION SPHERICAL HEAL 6.5MM CANCELLOUS Orthopedic Implant   [facility] [id number] Left 1  LINER OD 36X58MM - [id number] Liner LINER OD 36X58MM  [employer] [id number] Left 1  SHELL USE W/LINR OD 58MM - [id number] Shell SHELL USE W/LINR OD 58MM  [employer] [id number] Left 1  36 MM L/ +8 12/14 TAPER FEMORAL HEAD Orthopedic Implant   [employer] [id number] Left 1  HIGH OFFSET ANTHOLOGY POROUS PLUS HA FEMORAL COMPONENT Orthopedic Implant     [employer] [id number] Left 1

## 2014-06-11 LAB — CBC
HCT: 34.9 % — ABNORMAL LOW (ref 39.0–52.0)
Hemoglobin: 11.7 g/dL — ABNORMAL LOW (ref 13.0–17.0)
MCH: 30.3 pg (ref 26.0–34.0)
MCHC: 33.5 g/dL (ref 30.0–36.0)
MCV: 90.4 fL (ref 78.0–100.0)
Platelets: 202 10*3/uL (ref 150–400)
RBC: 3.86 MIL/uL — ABNORMAL LOW (ref 4.22–5.81)
RDW: 14.1 % (ref 11.5–15.5)
WBC: 7.6 10*3/uL (ref 4.0–10.5)

## 2014-06-11 LAB — BASIC METABOLIC PANEL
Anion gap: 14 (ref 5–15)
BUN: 11 mg/dL (ref 6–23)
CO2: 24 mEq/L (ref 19–32)
Calcium: 8.6 mg/dL (ref 8.4–10.5)
Chloride: 100 mEq/L (ref 96–112)
Creatinine, Ser: 0.93 mg/dL (ref 0.50–1.35)
GFR calc Af Amer: >90 mL/min (ref >90)
GFR calc non Af Amer: >90 mL/min (ref >90)
Glucose, Bld: 112 mg/dL — ABNORMAL HIGH (ref 70–99)
Potassium: 3.7 mEq/L (ref 3.7–5.3)
Sodium: 138 mEq/L (ref 137–147)

## 2014-06-11 NOTE — Plan of Care (Signed)
Problem: Consults Goal: Diagnosis- Total Joint Replacement Primary Total Hip Left  Problem: Phase I Progression Outcomes Goal: CMS/Neurovascular status WDL Outcome: Completed/Met Date Met:  06/11/14 Goal: Hemodynamically stable Outcome: Completed/Met Date Met:  06/11/14

## 2014-06-11 NOTE — Care Management Note (Signed)
CARE MANAGEMENT NOTE 06/11/2014  Patient:  Franne FortsXUM,Nijee   Account Number:  [id number]  Date Initiated:  06/11/2014  Documentation initiated by:  Vance PeperBRADY,Bristyn Kulesza  Subjective/Objective Assessment:   53 yr old male admitted with left hip osteoarthritis. Patient had a left total hip arthroplasty.     Action/Plan:   Case manager spooke with patient concerning home health and DME needs at discharge. Choice offered. [place] [facility]. Patient has RW, states he doesnt need 3in1. Has family support at discharge.   Anticipated DC Date:  06/12/2014   Anticipated DC Plan:  HOME W HOME HEALTH SERVICES      DC Planning Services  CM consult      [facility]   Choice offered to / List presented to:  C-1 Patient   DME arranged  NA        HH arranged  HH-2 PT      HH agency  [facility]   Status of service:  Completed, signed off Medicare Important Message given?   (If response is "NO", the following Medicare IM given date fields will be blank) Date Medicare IM given:   Medicare IM given by:   Date Additional Medicare IM given:   Additional Medicare IM given by:    Discharge Disposition:  HOME W HOME HEALTH SERVICES  Per UR Regulation:  Reviewed for med. necessity/level of care/duration of stay  If discussed at Long Length of Stay Meetings, dates discussed:    Comments:

## 2014-06-11 NOTE — Progress Notes (Signed)
Utilization review completed.  
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
## 2014-06-11 NOTE — Progress Notes (Signed)
   Subjective:  Patient reports pain as moderate.  No events.  Stood at bedside earlier today.  Objective:   VITALS:   Filed Vitals:   06/10/14 1745 06/10/14 2002 06/11/14 0142 06/11/14 0601  BP: 119/69 132/81 125/66 113/77  Pulse: 81 79 89 86  Temp: 98.6 F (37 C) 98.5 F (36.9 C) 98.7 F (37.1 C) 98.1 F (36.7 C)  TempSrc:  Oral Oral   Resp: 18     Weight:      SpO2: 97% 92% 96% 97%    Neurologically intact Neurovascular intact Sensation intact distally Intact pulses distally Dorsiflexion/Plantar flexion intact Incision: dressing C/D/I and no drainage No cellulitis present Compartment soft   Lab Results  Component Value Date   WBC 7.6 06/11/2014   HGB 11.7* 06/11/2014   HCT 34.9* 06/11/2014   MCV 90.4 06/11/2014   PLT 202 06/11/2014     Assessment/Plan:  1 Day Post-Op   - Expected postop acute blood loss anemia - will monitor for symptoms - Up with PT/OT - DVT ppx - SCDs, ambulation, asa - WBAT left lower extremity - foley out today - Pain control - Discharge planning  Cheral AlmasXu, Naiping Michael 06/11/2014, 7:58 AM [phone]
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
## 2014-06-12 LAB — CBC
HCT: 36.2 % — ABNORMAL LOW (ref 39.0–52.0)
Hemoglobin: 12.1 g/dL — ABNORMAL LOW (ref 13.0–17.0)
MCH: 30.7 pg (ref 26.0–34.0)
MCHC: 33.4 g/dL (ref 30.0–36.0)
MCV: 91.9 fL (ref 78.0–100.0)
Platelets: 214 10*3/uL (ref 150–400)
RBC: 3.94 MIL/uL — ABNORMAL LOW (ref 4.22–5.81)
RDW: 13.9 % (ref 11.5–15.5)
WBC: 7.4 10*3/uL (ref 4.0–10.5)

## 2014-06-12 NOTE — Plan of Care (Signed)
Problem: Phase I Progression Outcomes Goal: Pain controlled with appropriate interventions Outcome: Completed/Met Date Met:  06/12/14     

## 2014-06-12 NOTE — Progress Notes (Signed)
   Subjective:  Patient reports no events.  Objective:   VITALS:   Filed Vitals:   06/11/14 0601 06/11/14 1558 06/11/14 2008 06/12/14 0557  BP: 113/77 145/87 127/72 141/95  Pulse: 86 88 80 111  Temp: 98.1 F (36.7 C) 98.9 F (37.2 C) 99.2 F (37.3 C) 98.2 F (36.8 C)  TempSrc:  Oral Oral Oral  Resp:  18 17 20   Weight:      SpO2: 97% 97% 97% 99%    Incision c/d/i No signs of infection   Lab Results  Component Value Date   WBC 7.4 06/12/2014   HGB 12.1* 06/12/2014   HCT 36.2* 06/12/2014   MCV 91.9 06/12/2014   PLT 214 06/12/2014     Assessment/Plan:  2 Days Post-Op   - Expected postop acute blood loss anemia - will monitor for symptoms - dc home today - Rx in chart  Cheral AlmasXu, Cylas Falzone Michael 06/12/2014, 7:57 AM [phone]

## 2014-06-12 NOTE — Plan of Care (Signed)
Problem: Phase II Progression Outcomes Goal: Tolerating diet Outcome: Completed/Met Date Met:  06/12/14

## 2014-06-12 NOTE — Plan of Care (Signed)
Problem: Phase II Progression Outcomes Goal: Ambulates Outcome: Completed/Met Date Met:  06/12/14

## 2014-06-12 NOTE — Progress Notes (Signed)
Physical Therapy Treatment Patient Details Name: Nathaniel [name] MRN: [id number] DOB: [birth year] Today's Date: 06/12/2014    History of Present Illness s/p L THA anterior approach; No precautions    PT Comments    Overall managing quite well, including a flight of steps with cane and rail; OK for dc home from PT standpoint   Follow Up Recommendations  Home health PT;Supervision - Intermittent     Equipment Recommendations  3in1 (PT) (Pt already owns RW; Consider BSC; will defer to OT)    Recommendations for Other Services       Precautions / Restrictions Precautions Precaution Comments: Anterior approach; no precautions Restrictions Weight Bearing Restrictions: Yes LLE Weight Bearing: Weight bearing as tolerated    Mobility  Bed Mobility                  Transfers Overall transfer level: Needs assistance Equipment used: Rolling walker (2 wheeled) Transfers: Sit to/from Stand Sit to Stand: Supervision         General transfer comment: cues for technique and hand placement for stand to sit. Pt able to perform sit to stand without use of RW. Qutie effortful with stand to sit  Ambulation/Gait Ambulation/Gait assistance: Supervision;Modified independent (Device/Increase time) Ambulation Distance (Feet): 200 Feet Assistive device: Rolling walker (2 wheeled) Gait Pattern/deviations: Step-through pattern Gait velocity: decreased   General Gait Details: Cues mostly for posture and to self-monitor for activity tolerance   Stairs Stairs: Yes Stairs assistance: Supervision Stair Management: One rail Right;With cane;Forwards;Step to pattern Number of Stairs: 12 General stair comments: Cues for first using two hands on one rail, then to use cane and rail; managing quite well with cane and rail  Wheelchair Mobility    Modified Rankin (Stroke Patients Only)       Balance     Sitting balance-Leahy Scale: Good       Standing balance-Leahy Scale: Fair                       Cognition Arousal/Alertness: Awake/alert Behavior During Therapy: WFL for tasks assessed/performed Overall Cognitive Status: Within Functional Limits for tasks assessed                      Exercises      General Comments        Pertinent Vitals/Pain Pain Assessment: 0-10 Pain Score: 5  Pain Location: L hip Pain Descriptors / Indicators: Aching;Burning Pain Intervention(s): Ice applied;Monitored during session    Home Living                      Prior Function            PT Goals (current goals can now be found in the care plan section) Acute Rehab PT Goals Patient Stated Goal: get home PT Goal Formulation: With patient Potential to Achieve Goals: Good Progress towards PT goals: Progressing toward goals    Frequency  7X/week    PT Plan Current plan remains appropriate    Co-evaluation             End of Session Equipment Utilized During Treatment: Gait belt Activity Tolerance: Patient tolerated treatment well Patient left: with call bell/phone within reach;in bed     Time: 0825-0845 PT Time Calculation (min): 20 min  Charges:  $Gait Training: 8-22 mins                    G Codes:  [name], Nathaniel [name] [facility]amff 06/12/2014, 10:23 AM  Van ClinesHolly Janey [name], PT  Acute Rehabilitation Services Pager [phone] Office [phone]

## 2014-06-13 ENCOUNTER — Encounter (HOSPITAL_COMMUNITY): Payer: Self-pay | Admitting: Orthopaedic Surgery
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
## 2016-12-09 ENCOUNTER — Other Ambulatory Visit: Payer: Self-pay | Admitting: Internal Medicine

## 2016-12-09 ENCOUNTER — Ambulatory Visit
Admission: RE | Admit: 2016-12-09 | Discharge: 2016-12-09 | Disposition: A | Discharge status: Home / Self Care | Payer: 59 | Source: Ambulatory Visit | Attending: Internal Medicine | Admitting: Internal Medicine

## 2016-12-09 DIAGNOSIS — Z720 Tobacco use: Secondary | ICD-10-CM

## 2016-12-23 ENCOUNTER — Encounter (INDEPENDENT_AMBULATORY_CARE_PROVIDER_SITE_OTHER): Payer: Self-pay | Admitting: Physician Assistant

## 2016-12-23 ENCOUNTER — Ambulatory Visit (INDEPENDENT_AMBULATORY_CARE_PROVIDER_SITE_OTHER): Payer: 59

## 2016-12-23 ENCOUNTER — Ambulatory Visit (INDEPENDENT_AMBULATORY_CARE_PROVIDER_SITE_OTHER): Payer: 59 | Admitting: Physician Assistant

## 2016-12-23 VITALS — Ht 74.0 in | Wt 263.0 lb

## 2016-12-23 DIAGNOSIS — M25571 Pain in right ankle and joints of right foot: Secondary | ICD-10-CM

## 2016-12-23 DIAGNOSIS — M25551 Pain in right hip: Secondary | ICD-10-CM

## 2016-12-23 MED ORDER — LIDOCAINE HCL 1 % IJ SOLN
3.0000 mL | INTRAMUSCULAR | Status: AC | PRN
  Administered 2016-12-23: 3 mL

## 2016-12-23 MED ORDER — METHYLPREDNISOLONE ACETATE 40 MG/ML IJ SUSP
40.0000 mg | INTRAMUSCULAR | Status: AC | PRN
  Administered 2016-12-23: 40 mg via INTRA_ARTICULAR
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
## 2016-12-29 ENCOUNTER — Ambulatory Visit (INDEPENDENT_AMBULATORY_CARE_PROVIDER_SITE_OTHER): Payer: 59

## 2016-12-29 ENCOUNTER — Ambulatory Visit (INDEPENDENT_AMBULATORY_CARE_PROVIDER_SITE_OTHER): Payer: 59 | Admitting: Physical Medicine and Rehabilitation

## 2016-12-29 ENCOUNTER — Encounter (INDEPENDENT_AMBULATORY_CARE_PROVIDER_SITE_OTHER): Payer: Self-pay | Admitting: Physical Medicine and Rehabilitation

## 2016-12-29 DIAGNOSIS — M25551 Pain in right hip: Secondary | ICD-10-CM | POA: Diagnosis not present

## 2016-12-29 MED ORDER — BUPIVACAINE HCL 0.5 % IJ SOLN
3.0000 mL | INTRAMUSCULAR | Status: AC | PRN
  Administered 2016-12-29: 3 mL via INTRA_ARTICULAR

## 2016-12-29 MED ORDER — TRIAMCINOLONE ACETONIDE 40 MG/ML IJ SUSP
80.0000 mg | INTRAMUSCULAR | Status: AC | PRN
  Administered 2016-12-29: 80 mg via INTRA_ARTICULAR

## 2016-12-29 NOTE — Progress Notes (Deleted)
Right hip pain for about 3 years which radiates from groin to buttock. Hip pain is worse with standing and walking.

## 2016-12-29 NOTE — Patient Instructions (Signed)

## 2016-12-29 NOTE — Progress Notes (Signed)
Nathaniel [name] - 56 y.o. male MRN [id number]  Date of birth: [birth year]  Office Visit Note: Visit Date: 12/29/2016 PCP: Nathaniel NobleGates, Robert, MD Referred by: Nathaniel NobleGates, Robert, MD  Subjective: Chief Complaint  Patient presents with  . Right Hip - Pain   HPI: Nathaniel [name] is a 56 year old gentleman with right hip and groin pain has been going on for several years and just worsening progressively. This pain radiates into the groin and buttock region. Is worse with standing and walking and moving. He denies left-sided complaints. Nathaniel [name] requests a diagnostic and therapeutic anesthetic hip arthrogram.    ROS Otherwise per HPI.  Assessment & Plan: Visit Diagnoses:  1. Pain in right hip     Plan: Findings:  Diagnostic and therapeutic right anesthetic hip arthrogram. Patient did get relief in anesthetic portion of the injection.    Meds & Orders: No orders of the defined types were placed in this encounter.   Orders Placed This Encounter  Procedures  . Large Joint Injection/Arthrocentesis  . XR C-ARM NO REPORT    Follow-up: Return if symptoms worsen or fail to improve, for Nathaniel [name].   Procedures: Hip anesthetic arthrogram Date/Time: 12/29/2016 9:30 AM Performed by: Nathaniel [name] Authorized by: Nathaniel [name]   Consent Given by:  Patient Site marked: the procedure site was marked   Timeout: prior to procedure the correct patient, procedure, and site was verified   Indications:  Pain and diagnostic evaluation Location:  Hip Prep: patient was prepped and draped in usual sterile fashion   Needle Size:  22 G Approach:  Anterior Ultrasound Guidance: No   Fluoroscopic Guidance: No   Arthrogram: Yes   Medications:  3 mL bupivacaine 0.5 %; 80 mg triamcinolone acetonide 40 MG/ML Aspiration Attempted: Yes   Patient tolerance:  Patient tolerated the procedure well with no immediate complications  Arthrogram demonstrated excellent flow of contrast throughout the joint surface without  extravasation or obvious defect.  The patient had relief of symptoms during the anesthetic phase of the injection.      No notes on file   Clinical History: No specialty comments available.  He reports that he has been smoking E-cigarettes.  He has smoked for the past 20.00 years. He has never used smokeless tobacco. No results for input(s): HGBA1C, LABURIC in the last 8760 hours.  Objective:  VS:  HT:    WT:   BMI:     BP:   HR: bpm  TEMP: ( )  RESP:  Physical Exam  Musculoskeletal:  Patient is with a cane. He has an ankle brace on the right ankle. He walks with an antalgic gait.    Ortho Exam Imaging: Xr C-arm No Report  Result Date: 12/29/2016 Please see Notes or Procedures tab for imaging impression.   Past Medical/Family/Surgical/Social History: Medications & Allergies reviewed per EMR Patient Active Problem List   Diagnosis Date Noted  . Osteonecrosis of left hip (HCC) 06/10/2014   Past Medical History:  Diagnosis Date  . Arthritis   . Hx MRSA infection 2004   in the  foot   History reviewed. No pertinent family history. Past Surgical History:  Procedure Laterality Date  . FOOT SURGERY    . FOOT SURGERY Right    has a steele rod  . HAND FUSION Right 1988  . TOTAL HIP ARTHROPLASTY Left 06/10/2014   Procedure: LEFT TOTAL HIP ARTHROPLASTY ANTERIOR APPROACH;  Surgeon: Nathaniel AlmasNaiping Michael Xu, MD;  Location: [facility] OR;  Service: Orthopedics;  Laterality: Left;  .  WRIST SURGERY     Social History   Occupational History  . Not on file.   Social History Main Topics  . Smoking status: Current Every Day Smoker    Years: 20.00    Types: E-cigarettes  . Smokeless tobacco: Never Used  . Alcohol use No  . Drug use: No  . Sexual activity: Not on file

## 2017-02-02 ENCOUNTER — Ambulatory Visit (INDEPENDENT_AMBULATORY_CARE_PROVIDER_SITE_OTHER): Payer: 59 | Admitting: Orthopaedic Surgery

## 2017-02-02 DIAGNOSIS — M19171 Post-traumatic osteoarthritis, right ankle and foot: Secondary | ICD-10-CM | POA: Diagnosis not present
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
## 2017-02-07 ENCOUNTER — Encounter (INDEPENDENT_AMBULATORY_CARE_PROVIDER_SITE_OTHER): Payer: Self-pay | Admitting: Orthopaedic Surgery

## 2017-02-07 ENCOUNTER — Ambulatory Visit (INDEPENDENT_AMBULATORY_CARE_PROVIDER_SITE_OTHER): Payer: 59 | Admitting: Orthopaedic Surgery

## 2017-02-07 DIAGNOSIS — M19171 Post-traumatic osteoarthritis, right ankle and foot: Secondary | ICD-10-CM
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
## 2017-03-23 ENCOUNTER — Encounter (INDEPENDENT_AMBULATORY_CARE_PROVIDER_SITE_OTHER): Payer: Self-pay | Admitting: Physician Assistant

## 2017-03-23 ENCOUNTER — Ambulatory Visit (INDEPENDENT_AMBULATORY_CARE_PROVIDER_SITE_OTHER): Payer: 59 | Admitting: Physician Assistant

## 2017-03-23 VITALS — Ht 75.0 in | Wt 265.0 lb

## 2017-03-23 DIAGNOSIS — M19171 Post-traumatic osteoarthritis, right ankle and foot: Secondary | ICD-10-CM

## 2017-03-23 MED ORDER — LIDOCAINE HCL 1 % IJ SOLN
3.0000 mL | INTRAMUSCULAR | Status: AC | PRN
  Administered 2017-03-23: 3 mL

## 2017-03-23 MED ORDER — METHYLPREDNISOLONE ACETATE 40 MG/ML IJ SUSP
40.0000 mg | INTRAMUSCULAR | Status: AC | PRN
  Administered 2017-03-23: 40 mg via INTRA_ARTICULAR

## 2017-03-23 NOTE — Progress Notes (Signed)
   Procedure Note  Patient: Nathaniel [name]             Date of Birth: October 02, 1960           MRN: [id number]             Visit Date: 03/23/2017 History of present illness: Nathaniel [name] 56 year old male well known to Dr. Magnus IvanBlackman services comes in today due to right ankle pain. Again he has posttraumatic arthritis the right ankle. He was seen back in May this year and received a cortisone injection in the ankle and did well until recently. He is requesting a cortisone injection today. He's had no new injuries. Denies any change in medications or any new health issues.   Right ankle: No abnormal warmth or erythema. Decreased dorsiflexion plantar flexion ankle.  Procedures: Visit Diagnoses: No diagnosis found.  Medium Joint Inj Date/Time: 03/23/2017 2:11 PM Performed by: Kirtland BouchardLARK, Flonnie Wierman W Authorized by: Kirtland BouchardLARK, Macey Wurtz W   Location:  Ankle Needle Size:  22 G Needle Length:  1.5 inches Approach:  Anterolateral Ultrasound Guided: No   Fluoroscopic Guidance: No   Medications:  3 mL lidocaine 1 %; 40 mg methylPREDNISolone acetate 40 MG/ML Aspiration Attempted: No     Plan: We'll see him back on an as-needed basis for his ankle. He understands he can only have injections every 3 months

## 2017-09-01 ENCOUNTER — Ambulatory Visit (INDEPENDENT_AMBULATORY_CARE_PROVIDER_SITE_OTHER): Payer: PRIVATE HEALTH INSURANCE | Admitting: Orthopaedic Surgery

## 2017-09-01 ENCOUNTER — Encounter (INDEPENDENT_AMBULATORY_CARE_PROVIDER_SITE_OTHER): Payer: Self-pay | Admitting: Orthopaedic Surgery

## 2017-09-01 ENCOUNTER — Ambulatory Visit (INDEPENDENT_AMBULATORY_CARE_PROVIDER_SITE_OTHER): Payer: PRIVATE HEALTH INSURANCE

## 2017-09-01 DIAGNOSIS — M1611 Unilateral primary osteoarthritis, right hip: Secondary | ICD-10-CM | POA: Diagnosis not present

## 2017-09-01 NOTE — Progress Notes (Signed)
   Office Visit Note   Patient: Nathaniel [name]           Date of Birth: 02-Dec-1960           MRN: [id number] Visit Date: 09/01/2017              Requested by: Nathaniel [name], Robert, MD [address] [place], [place] [address] PCP: Nathaniel [name], Robert, MD   Assessment & Plan: Visit Diagnoses:  1. Primary localized osteoarthritis of right hip     Plan: Impression is right hip degenerative joint disease with osteonecrosis likely due to chronic alcohol use.  He wishes to undergo total hip replacement.  He is aware the risk benefits alternatives to surgery.  He had a good outcome with his left hip.  We will schedule him in the near future.  Follow-Up Instructions: Return for 10-14 days post-op right total hip replacement.   Orders:  Orders Placed This Encounter  Procedures  . XR HIP UNILAT W OR W/O PELVIS 2-3 VIEWS RIGHT   No orders of the defined types were placed in this encounter.     Procedures: No procedures performed   Clinical Data: No additional findings.   Subjective: Chief Complaint  Patient presents with  . Right Hip - Follow-up, Pain    Patient comes in today for severe right hip pain.  He is doing well from his left hip standpoint.  He is having significant difficulty with ADLs and pain.  He is limping.  He has no quality of life.    Review of Systems  Constitutional: Negative.   All other systems reviewed and are negative.    Objective: Vital Signs: There were no vitals taken for this visit.  Physical Exam  Constitutional: He is oriented to person, place, and time. He appears well-developed and well-nourished.  Pulmonary/Chest: Effort normal.  Abdominal: Soft.  Neurological: He is alert and oriented to person, place, and time.  Skin: Skin is warm.  Psychiatric: He has a normal mood and affect. His behavior is normal. Judgment and thought content normal.  Nursing note and vitals reviewed.   Ortho Exam Right hip exam shows significant pain with any  rotation.  Positive Stinchfield sign. Specialty Comments:  No specialty comments available.  Imaging: No results found.   PMFS History: Patient Active Problem List   Diagnosis Date Noted  . Primary localized osteoarthritis of right hip 09/01/2017  . Post-traumatic arthritis of right ankle 02/07/2017  . Osteonecrosis of left hip (HCC) 06/10/2014   Past Medical History:  Diagnosis Date  . Arthritis   . Hx MRSA infection 2004   in the  foot    History reviewed. No pertinent family history.  Past Surgical History:  Procedure Laterality Date  . FOOT SURGERY    . FOOT SURGERY Right    has a steele rod  . HAND FUSION Right 1988  . TOTAL HIP ARTHROPLASTY Left 06/10/2014   Procedure: LEFT TOTAL HIP ARTHROPLASTY ANTERIOR APPROACH;  Surgeon: Cheral AlmasNaiping Michael Jamesyn Lindell, MD;  Location: [facility] OR;  Service: Orthopedics;  Laterality: Left;  . WRIST SURGERY     Social History   Occupational History  . Not on file  Tobacco Use  . Smoking status: Current Every Day Smoker    Years: 20.00    Types: E-cigarettes  . Smokeless tobacco: Never Used  Substance and Sexual Activity  . Alcohol use: No  . Drug use: No  . Sexual activity: Not on file

## 2017-09-07 NOTE — Pre-Procedure Instructions (Signed)
Nathaniel [name]  09/07/2017      [facility] - [place] - [address] [place] [address] Phone: [phone] Fax: [phone]  [facility] - [place] - [address] [place] [address] Phone: [phone] Fax: [phone]    Your procedure is scheduled on February 6  [facility] at [employer] [employer] A.M.  Call this number if you have problems the morning of surgery:  [phone]   Remember:  Do not eat food or drink liquids after midnight.  Continue all medications as directed by your physician except follow these medication instructions before surgery below   Take these medicines the morning of surgery with A SIP OF WATER  amLODipine (NORVASC) omeprazole (PRILOSEC)  traMADol (ULTRAM)  7 days prior to surgery STOP taking any Aspirin(unless otherwise instructed by your surgeon), Aleve, Naproxen, Ibuprofen, Motrin, Advil, Goody's, BC's, all herbal medications, fish oil, and all vitamins    Do not wear jewelry  Do not wear lotions, powders, or cologne, or deodorant.  Men may shave face and neck.  Do not bring valuables to the hospital.  [facility] is not responsible for any belongings or valuables.  Contacts, dentures or bridgework may not be worn into surgery.  Leave your suitcase in the car.  After surgery it may be brought to your room.  For patients admitted to the hospital, discharge time will be determined by your treatment team.  Patients discharged the day of surgery will not be allowed to drive home.   Special instructions:   [place]- Preparing For Surgery  Before surgery, you can play an important role. Because skin is not sterile, your skin needs to be as free of germs as possible. You can reduce the number of germs on your skin by washing with CHG (chlorahexidine gluconate) Soap before surgery.  CHG is an antiseptic cleaner which kills germs  and bonds with the skin to continue killing germs even after washing.  Please do not use if you have an allergy to CHG or antibacterial soaps. If your skin becomes reddened/irritated stop using the CHG.  Do not shave (including legs and underarms) for at least 48 hours prior to first CHG shower. It is OK to shave your face.  Please follow these instructions carefully.   1. Shower the NIGHT BEFORE SURGERY and the MORNING OF SURGERY with CHG.   2. If you chose to wash your hair, wash your hair first as usual with your normal shampoo.  3. After you shampoo, rinse your hair and body thoroughly to remove the shampoo.  4. Use CHG as you would any other liquid soap. You can apply CHG directly to the skin and wash gently with a scrungie or a clean washcloth.   5. Apply the CHG Soap to your body ONLY FROM THE NECK DOWN.  Do not use on open wounds or open sores. Avoid contact with your eyes, ears, mouth and genitals (private parts). Wash Face and genitals (private parts)  with your normal soap.  6. Wash thoroughly, paying special attention to the area where your surgery will be performed.  7. Thoroughly rinse your body with warm water from the neck down.  8. DO NOT shower/wash with your normal soap after using and rinsing off the CHG Soap.  9. Pat yourself dry with a CLEAN TOWEL.  10. Wear CLEAN PAJAMAS to bed the night before surgery, wear  comfortable clothes the morning of surgery  11. Place CLEAN SHEETS on your bed the night of your first shower and DO NOT SLEEP WITH PETS.    Day of Surgery: Do not apply any deodorants/lotions. Please wear clean clothes to the hospital/surgery center.      Please read over the following fact sheets that you were given.

## 2017-09-08 ENCOUNTER — Inpatient Hospital Stay (HOSPITAL_COMMUNITY)
Admission: RE | Admit: 2017-09-08 | Discharge: 2017-09-08 | Disposition: A | Discharge status: Home / Self Care | Payer: 59 | Source: Ambulatory Visit

## 2017-09-13 ENCOUNTER — Telehealth (INDEPENDENT_AMBULATORY_CARE_PROVIDER_SITE_OTHER): Payer: Self-pay | Admitting: Orthopaedic Surgery

## 2017-09-13 MED ORDER — TRANEXAMIC ACID 1000 MG/10ML IV SOLN
2000.0000 mg | INTRAVENOUS | Status: DC
  Filled 2017-09-13: qty 20

## 2017-09-13 MED ORDER — DEXTROSE 5 % IV SOLN
3.0000 g | INTRAVENOUS | Status: DC
  Filled 2017-09-13: qty 3000

## 2017-09-13 NOTE — Telephone Encounter (Signed)
SEE MESSAGE BELOW:

## 2017-09-13 NOTE — Progress Notes (Signed)
Notified Sherri at Dr. Warren DanesXu's office that patient stated he wanted to cancel.

## 2017-09-13 NOTE — Telephone Encounter (Signed)
Patient called to let Dr Roda ShuttersXu know that he had to cancel his insurance because the insurance company would not cover his surgery. The number to contact patient is [phone]

## 2017-09-13 NOTE — Progress Notes (Signed)
Patient states due to insurance he is cancelling surgery.

## 2017-09-14 ENCOUNTER — Ambulatory Visit (HOSPITAL_COMMUNITY): Admission: RE | Admit: 2017-09-14 | Payer: 59 | Source: Ambulatory Visit | Admitting: Orthopaedic Surgery

## 2017-09-14 ENCOUNTER — Encounter (HOSPITAL_COMMUNITY): Admission: RE | Payer: Self-pay | Source: Ambulatory Visit

## 2017-09-14 SURGERY — ARTHROPLASTY, HIP, TOTAL, ANTERIOR APPROACH
Anesthesia: Spinal | Laterality: Right

## 2017-11-28 ENCOUNTER — Telehealth (INDEPENDENT_AMBULATORY_CARE_PROVIDER_SITE_OTHER): Payer: Self-pay | Admitting: Orthopaedic Surgery

## 2017-11-28 NOTE — Telephone Encounter (Signed)
Patient called stating that he is needing Dr. Roda ShuttersXu to write a letter to his lawyer stating what his health problems are so he can get his disability. CB # [phone]

## 2017-11-29 NOTE — Telephone Encounter (Signed)
Please advise 

## 2017-11-29 NOTE — Telephone Encounter (Signed)
Probably best to send this to dr.xu for when he gets back

## 2017-11-29 NOTE — Telephone Encounter (Signed)
Please advise. See message below.  

## 2017-12-02 NOTE — Telephone Encounter (Signed)
To whom it may concern,Mr. Nathaniel FearingJames [name] is a gentleman who I have taken care of in the past for severe bilateral hip degenerative joint disease for which I have replaced his left hip.  He is now suffering from severe and advanced right hip degenerative joint disease that is functionally disabling and limiting.  From a work standpoint I feel that he would have significant difficulty with any prolonged standing or walking or physical activity.  He is more appropriate for sedentary work but does not require any heavy lifting.  Please do not hesitate to contact me with any questions.  Sincerely,Michael Roda ShuttersXu

## 2017-12-05 ENCOUNTER — Encounter (INDEPENDENT_AMBULATORY_CARE_PROVIDER_SITE_OTHER): Payer: Self-pay

## 2017-12-05 NOTE — Telephone Encounter (Signed)
Letter is ready for pick up. Called patient he is aware.

## 2017-12-27 ENCOUNTER — Ambulatory Visit (INDEPENDENT_AMBULATORY_CARE_PROVIDER_SITE_OTHER): Payer: PRIVATE HEALTH INSURANCE | Admitting: Orthopaedic Surgery

## 2017-12-27 ENCOUNTER — Encounter (INDEPENDENT_AMBULATORY_CARE_PROVIDER_SITE_OTHER): Payer: Self-pay | Admitting: Orthopaedic Surgery

## 2017-12-27 ENCOUNTER — Ambulatory Visit (INDEPENDENT_AMBULATORY_CARE_PROVIDER_SITE_OTHER): Payer: PRIVATE HEALTH INSURANCE

## 2017-12-27 DIAGNOSIS — M25511 Pain in right shoulder: Secondary | ICD-10-CM

## 2017-12-27 NOTE — Progress Notes (Signed)
    Office Visit Note   Patient: Nathaniel [name]           Date of Birth: [birth year]           MRN: [id number] Visit Date:               Requested by: Marden Noble, MD [address] [place], [place] [address] PCP: Marden Noble, MD   Assessment & Plan: Visit Diagnoses:  1. Acute pain of right shoulder     Plan: Impression is suspected right rotator cuff tear superimposed on right glenohumeral arthritis.  His exam is concerning for more than just arthriti therefore I like to get an MRI to look a large rotator cuff tear.  Follow-up afterwards.  Follow-Up Instructions: Return in about 1 week (around 01/03/2018).   Orders:  Orders Placed This Encounter  Procedures  . XR Shoulder Right   No orders of the defined types were placed in this encounter.     Procedures: No procedures performed   Clinical Data: No additional findings.   Subjective: Chief Complaint  Patient presents with  . Right Shoulder - Pain    [name] comes in today with acute right shoulder pain.  He had a fall 4 days ago.  He is now having significant pain and inability to raise his arm.   Review of Systems  Constitutional: Negative.   All other systems reviewed and are negative.    Objective: Vital Signs: There were no vitals taken for this visit.  Physical Exam  Constitutional: He is oriented to person, place, and time. He appears well-developed and well-nourished.  Pulmonary/Chest: Effort normal.  Abdominal: Soft.  Neurological: He is alert and oriented to person, place, and time.  Skin: Skin is warm.  Psychiatric: He has a normal mood and affect. His behavior is normal. Judgment and thought content normal.  Nursing note and vitals reviewed.   Ortho Exam Right shoulder exam shows severe limitation in active range of motion.  Significant guarding and withdrawing to passive range of motion. Specialty Comments:  No specialty comments available.  Imaging: Xr Shoulder  Right  Result Date: 12/27/2017 Advanced degenerative joint disease right glenohumeral joint    PMFS History: Patient Active Problem List   Diagnosis Date Noted  . Primary localized osteoarthritis of right hip 09/01/2017  . Post-traumatic arthritis of right ankle 02/07/2017  . Osteonecrosis of left hip (HCC) 06/10/2014   Past Medical History:  Diagnosis Date  . Arthritis   . Hx MRSA infection 2004   in the  foot    No family history on file.  Past Surgical History:  Procedure Laterality Date  . FOOT SURGERY    . FOOT SURGERY Right    has a steele rod  . HAND FUSION Right 1988  . TOTAL HIP ARTHROPLASTY Left 06/10/2014   Procedure: LEFT TOTAL HIP ARTHROPLASTY ANTERIOR APPROACH;  Surgeon: Cheral Almas, MD;  Location: [facility] OR;  Service: Orthopedics;  Laterality: Left;  . WRIST SURGERY     Social History   Occupational History  . Not on file  Tobacco Use  . Smoking status: Current Every Day Smoker    Years: 20.00    Types: E-cigarettes  . Smokeless tobacco: Never Used  Substance and Sexual Activity  . Alcohol use: No  . Drug use: No  . Sexual activity: Not on file

## 2017-12-27 NOTE — Addendum Note (Signed)
Addended by: Albertina Parr on: 12/27/2017 03:56 PM   Modules accepted: Orders

## 2018-01-03 ENCOUNTER — Ambulatory Visit (INDEPENDENT_AMBULATORY_CARE_PROVIDER_SITE_OTHER): Payer: PRIVATE HEALTH INSURANCE | Admitting: Orthopaedic Surgery

## 2019-06-28 ENCOUNTER — Other Ambulatory Visit: Payer: Self-pay

## 2019-06-28 DIAGNOSIS — Z20822 Contact with and (suspected) exposure to covid-19: Secondary | ICD-10-CM

## 2019-07-01 LAB — NOVEL CORONAVIRUS, NAA: SARS-CoV-2, NAA: NOT DETECTED

## 2019-11-01 ENCOUNTER — Ambulatory Visit: Payer: PRIVATE HEALTH INSURANCE | Attending: Internal Medicine

## 2019-11-01 DIAGNOSIS — Z23 Encounter for immunization: Secondary | ICD-10-CM

## 2019-11-01 NOTE — Progress Notes (Signed)
   Covid-19 Vaccination Clinic  Name:  Nathaniel [name]    MRN: [id number] DOB: 1960/11/01  11/01/2019  Nathaniel [name] was observed post Covid-19 immunization for 15 minutes without incident. He was provided with Vaccine Information Sheet and instruction to access the V-Safe system.   Nathaniel [name] was instructed to call 911 with any severe reactions post vaccine: Marland Kitchen Difficulty breathing  . Swelling of face and throat  . A fast heartbeat  . A bad rash all over body  . Dizziness and weakness   Immunizations Administered    Name Date Dose VIS Date Route   Pfizer COVID-19 Vaccine 11/01/2019 12:48 PM 0.3 mL 07/20/2019 Intramuscular   Manufacturer: [employer], [employer]   Lot: [id number]   NDC: [address]-4

## 2019-11-26 ENCOUNTER — Ambulatory Visit: Payer: PRIVATE HEALTH INSURANCE | Attending: Internal Medicine

## 2019-11-26 DIAGNOSIS — Z23 Encounter for immunization: Secondary | ICD-10-CM

## 2019-11-26 NOTE — Progress Notes (Signed)
   Covid-19 Vaccination Clinic  Name:  Nathaniel [name]    MRN: [id number] DOB: March 16, 1961  11/26/2019  Mr. Nathaniel [name] was observed post Covid-19 immunization for 15 minutes without incident. He was provided with Vaccine Information Sheet and instruction to access the V-Safe system.   Mr. Nathaniel [name] was instructed to call 911 with any severe reactions post vaccine: Marland Kitchen Difficulty breathing  . Swelling of face and throat  . A fast heartbeat  . A bad rash all over body  . Dizziness and weakness   Immunizations Administered    Name Date Dose VIS Date Route   Pfizer COVID-19 Vaccine 11/26/2019 10:49 AM 0.3 mL 10/03/2018 Intramuscular   Manufacturer: [employer], [employer]   Lot: [id number]   NDC: [address]-9

## 2020-02-24 ENCOUNTER — Other Ambulatory Visit: Payer: Self-pay

## 2020-02-24 ENCOUNTER — Emergency Department (HOSPITAL_COMMUNITY)
Admission: EM | Admit: 2020-02-24 | Discharge: 2020-02-25 | Disposition: A | Discharge status: Home / Self Care | Payer: Medicare Other | Attending: Emergency Medicine | Admitting: Emergency Medicine

## 2020-02-24 ENCOUNTER — Encounter (HOSPITAL_COMMUNITY): Payer: Self-pay | Admitting: Emergency Medicine

## 2020-02-24 DIAGNOSIS — Z5321 Procedure and treatment not carried out due to patient leaving prior to being seen by health care provider: Secondary | ICD-10-CM | POA: Diagnosis not present

## 2020-02-24 DIAGNOSIS — M791 Myalgia, unspecified site: Secondary | ICD-10-CM | POA: Diagnosis not present

## 2020-02-24 LAB — COMPREHENSIVE METABOLIC PANEL
ALT: 52 U/L — ABNORMAL HIGH (ref 0–44)
AST: 66 U/L — ABNORMAL HIGH (ref 15–41)
Albumin: 4.5 g/dL (ref 3.5–5.0)
Alkaline Phosphatase: 100 U/L (ref 38–126)
Anion gap: 14 (ref 5–15)
BUN: 11 mg/dL (ref 6–20)
CO2: 22 mmol/L (ref 22–32)
Calcium: 9.7 mg/dL (ref 8.9–10.3)
Chloride: 94 mmol/L — ABNORMAL LOW (ref 98–111)
Creatinine, Ser: 1.16 mg/dL (ref 0.61–1.24)
GFR calc Af Amer: >60 mL/min (ref >60)
GFR calc non Af Amer: >60 mL/min (ref >60)
Glucose, Bld: 110 mg/dL — ABNORMAL HIGH (ref 70–99)
Potassium: 4 mmol/L (ref 3.5–5.1)
Sodium: 130 mmol/L — ABNORMAL LOW (ref 135–145)
Total Bilirubin: 1.4 mg/dL — ABNORMAL HIGH (ref 0.3–1.2)
Total Protein: 8 g/dL (ref 6.5–8.1)

## 2020-02-24 LAB — CBC
HCT: 47.6 % (ref 39.0–52.0)
Hemoglobin: 16.5 g/dL (ref 13.0–17.0)
MCH: 32.4 pg (ref 26.0–34.0)
MCHC: 34.7 g/dL (ref 30.0–36.0)
MCV: 93.5 fL (ref 80.0–100.0)
Platelets: 280 10*3/uL (ref 150–400)
RBC: 5.09 MIL/uL (ref 4.22–5.81)
RDW: 13.6 % (ref 11.5–15.5)
WBC: 4.8 10*3/uL (ref 4.0–10.5)
nRBC: 0 % (ref 0.0–0.2)

## 2020-02-24 LAB — LIPASE, BLOOD: Lipase: 22 U/L (ref 11–51)

## 2020-02-24 MED ORDER — SODIUM CHLORIDE 0.9% FLUSH: 3.0000 mL | Freq: Once | INTRAVENOUS | Status: DC

## 2020-02-24 NOTE — ED Notes (Signed)
Pt walked out door, states he is leaving.

## 2020-02-24 NOTE — ED Triage Notes (Signed)
Pt states he fell over family members wheelchair 1 month ago and felt like he broke his L ribs.  Reports unable to sleep and upper abd pain since then.  States if he turns on R side pain radiates to R hip and if he turns on L side pain radiates to back.  Denies nausea, vomiting, and diarrhea.  C/o lower back pain.

## 2020-02-25 ENCOUNTER — Encounter (HOSPITAL_COMMUNITY): Payer: Self-pay | Admitting: *Deleted

## 2020-02-25 ENCOUNTER — Emergency Department (HOSPITAL_COMMUNITY)
Admission: EM | Admit: 2020-02-25 | Discharge: 2020-02-25 | Disposition: A | Discharge status: Home / Self Care | Payer: Medicare Other | Attending: Emergency Medicine | Admitting: Emergency Medicine

## 2020-02-25 DIAGNOSIS — M545 Low back pain: Secondary | ICD-10-CM | POA: Diagnosis not present

## 2020-02-25 DIAGNOSIS — Z5321 Procedure and treatment not carried out due to patient leaving prior to being seen by health care provider: Secondary | ICD-10-CM | POA: Diagnosis not present

## 2020-02-25 DIAGNOSIS — M25551 Pain in right hip: Secondary | ICD-10-CM | POA: Diagnosis not present

## 2020-02-25 DIAGNOSIS — Z9181 History of falling: Secondary | ICD-10-CM | POA: Diagnosis not present

## 2020-02-25 DIAGNOSIS — M25561 Pain in right knee: Secondary | ICD-10-CM | POA: Diagnosis not present

## 2020-02-25 DIAGNOSIS — R1031 Right lower quadrant pain: Secondary | ICD-10-CM | POA: Insufficient documentation

## 2020-02-25 MED ORDER — SODIUM CHLORIDE 0.9% FLUSH: 3.0000 mL | Freq: Once | INTRAVENOUS | Status: DC

## 2020-02-25 NOTE — ED Triage Notes (Signed)
Pt states he fell about 3 weeks ago. Now has increased pain in RLQ, R Hip and knee with low back pain. Did not follow up after fall, has more pain at night.

## 2020-02-25 NOTE — ED Notes (Signed)
Unable to collect patient labs he left without being seen

## 2020-02-26 ENCOUNTER — Ambulatory Visit
Admission: RE | Admit: 2020-02-26 | Discharge: 2020-02-26 | Disposition: A | Discharge status: Home / Self Care | Payer: Medicare Other | Source: Ambulatory Visit | Attending: Internal Medicine | Admitting: Internal Medicine

## 2020-02-26 ENCOUNTER — Other Ambulatory Visit: Payer: Self-pay | Admitting: Internal Medicine

## 2020-02-26 DIAGNOSIS — M545 Low back pain, unspecified: Secondary | ICD-10-CM

## 2020-03-19 ENCOUNTER — Encounter: Payer: Self-pay | Admitting: Orthopaedic Surgery

## 2020-03-19 ENCOUNTER — Ambulatory Visit (INDEPENDENT_AMBULATORY_CARE_PROVIDER_SITE_OTHER): Payer: Medicare Other | Admitting: Orthopaedic Surgery

## 2020-03-19 DIAGNOSIS — G8929 Other chronic pain: Secondary | ICD-10-CM | POA: Diagnosis not present

## 2020-03-19 DIAGNOSIS — M545 Low back pain, unspecified: Secondary | ICD-10-CM

## 2020-03-19 MED ORDER — PREDNISONE 10 MG (21) PO TBPK: ORAL_TABLET | ORAL | 0 refills | Status: DC

## 2020-03-19 MED ORDER — METHOCARBAMOL 500 MG PO TABS: 500.0000 mg | ORAL_TABLET | Freq: Two times a day (BID) | ORAL | 0 refills | Status: DC | PRN

## 2020-03-19 NOTE — Progress Notes (Signed)
Office Visit Note   Patient: Nathaniel [name]           Date of Birth: 1961/01/22           MRN: [id number] Visit Date: 03/19/2020              Requested by: Marden Noble, MD [address] [place],  [place] [address] PCP: Marden Noble, MD   Assessment & Plan: Visit Diagnoses:  1. Chronic midline low back pain without sciatica     Plan: Impression is multiple discogenic and facet degenerative changes with moderate to severe canal stenosis and foraminal narrowing L3 S1 as well as mild bilateral S1 joint arthrosis. At this point, started the patient on a Sterapred taper muscle relaxer. We will urgently refer him to Dr. Alvester Morin per his request for epidural steroid injection. He will follow-up with [place] as needed.  Follow-Up Instructions: Return if symptoms worsen or fail to improve.   Orders:  No orders of the defined types were placed in this encounter.  Meds ordered this encounter  Medications  . predniSONE (STERAPRED UNI-PAK 21 TAB) 10 MG (21) TBPK tablet    Sig: Take as directed    Dispense:  21 tablet    Refill:  0  . methocarbamol (ROBAXIN) 500 MG tablet    Sig: Take 1 tablet (500 mg total) by mouth 2 (two) times daily as needed.    Dispense:  20 tablet    Refill:  0      Procedures: No procedures performed   Clinical Data: No additional findings.   Subjective: No chief complaint on file.   HPI patient is a very pleasant 59 year old gentleman who presents to our clinic today with back pain.  This began approximately 2 months ago when he was helping someone in a wheelchair go down a set of stairs.  The person the wheelchair put on the emergency brake and Lambert fell over the chair going down a set of stairs.  He had immediate pain to his mid to lower back.  He was seen in the ED where CT scan was obtained.  CT of the lumbar spine showed no acute fracture or traumatic listhesis.  He does have multiple discogenic and facet degenerative changes with moderate to  severe canal stenosis and foraminal narrowing L3 S1 as well as mild bilateral S1 joint arthrosis.  He comes in today for further evaluation treatment recommendation.  Pain he has does not radiate down either leg.  He notes that the pain is tolerable when in a seated position or with ambulation.  His pain becomes excruciating when he is lying supine.  He has been taking Norco without relief of symptoms.  He denies any numbness, tingling or burning.  No bowel or bladder change and no saddle paresthesias.  No lower extremity weakness.  Review of Systems as detailed in HPI.  All others reviewed and are negative.   Objective: Vital Signs: There were no vitals taken for this visit.  Physical Exam well-developed well-nourished gentleman in no acute distress.  Alert and oriented x3.  Ortho Exam examination of his lumbar spine reveals no spinous or paraspinous tenderness. He has no pain with lumbar flexion, extension or rotation. Negative straight leg raise both sides and no focal weakness besides. He is neurovascular intact distally.  Specialty Comments:  No specialty comments available.  Imaging: No new imaging   PMFS History: Patient Active Problem List   Diagnosis Date Noted  . Primary localized osteoarthritis  of right hip 09/01/2017  . Post-traumatic arthritis of right ankle 02/07/2017  . Osteonecrosis of left hip (HCC) 06/10/2014   Past Medical History:  Diagnosis Date  . Arthritis   . Hx MRSA infection 2004   in the  foot    History reviewed. No pertinent family history.  Past Surgical History:  Procedure Laterality Date  . FOOT SURGERY    . FOOT SURGERY Right    has a steele rod  . HAND FUSION Right 1988  . TOTAL HIP ARTHROPLASTY Left 06/10/2014   Procedure: LEFT TOTAL HIP ARTHROPLASTY ANTERIOR APPROACH;  Surgeon: Cheral Almas, MD;  Location: [facility] OR;  Service: Orthopedics;  Laterality: Left;  . WRIST SURGERY     Social History   Occupational History  . Not on file    Tobacco Use  . Smoking status: Current Every Day Smoker    Years: 20.00    Types: E-cigarettes  . Smokeless tobacco: Never Used  Substance and Sexual Activity  . Alcohol use: No  . Drug use: No  . Sexual activity: Not on file

## 2020-03-26 ENCOUNTER — Ambulatory Visit (INDEPENDENT_AMBULATORY_CARE_PROVIDER_SITE_OTHER): Payer: Medicare Other | Admitting: Physical Medicine and Rehabilitation

## 2020-03-26 ENCOUNTER — Ambulatory Visit: Payer: Self-pay

## 2020-03-26 ENCOUNTER — Encounter: Payer: Self-pay | Admitting: Physical Medicine and Rehabilitation

## 2020-03-26 ENCOUNTER — Other Ambulatory Visit: Payer: Self-pay

## 2020-03-26 ENCOUNTER — Other Ambulatory Visit: Payer: Self-pay | Admitting: Physician Assistant

## 2020-03-26 ENCOUNTER — Telehealth: Payer: Self-pay | Admitting: Physical Medicine and Rehabilitation

## 2020-03-26 VITALS — BP 159/95 | HR 134

## 2020-03-26 DIAGNOSIS — M48062 Spinal stenosis, lumbar region with neurogenic claudication: Secondary | ICD-10-CM

## 2020-03-26 DIAGNOSIS — M5416 Radiculopathy, lumbar region: Secondary | ICD-10-CM

## 2020-03-26 MED ORDER — METHYLPREDNISOLONE ACETATE 80 MG/ML IJ SUSP
80.0000 mg | Freq: Once | INTRAMUSCULAR | Status: AC
  Administered 2020-03-26: 80 mg

## 2020-03-26 NOTE — Progress Notes (Signed)
Pt states lower and middle back pain. Pt states he has pain in his neck. Pt states difficult sleeping and laying down makes the pain worse. Pt states take pain pills and alcohol to relief.   Numeric Pain Rating Scale and Functional Assessment Average Pain 10   In the last MONTH (on 0-10 scale) has pain interfered with the following?  1. General activity like being  able to carry out your everyday physical activities such as walking, climbing stairs, carrying groceries, or moving a chair?  Rating(10)   +Driver, -BT, -Dye Allergies.

## 2020-03-26 NOTE — Telephone Encounter (Signed)
Called patient to advise that he will need a driver.

## 2020-03-26 NOTE — Telephone Encounter (Signed)
Pt would like to know if he will need a driver following his injection this afternoon. Pt would like a CB with answer  [phone]

## 2020-04-03 NOTE — Progress Notes (Signed)
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 Lower Back - Pain  . Middle Back - Pain   HPI: Nathaniel [name] is a 59 y.o. male who comes in today at the request of Dr. Glee Arvin for planned Bilateral L5-S1 Lumbar epidural steroid injection with fluoroscopic guidance.  The patient has failed conservative care including home exercise, medications, time and activity modification.  This injection will be diagnostic and hopefully therapeutic.  Please see requesting physician notes for further details and justification.  CT reviewed with images and spine model.  CT reviewed in the note below.  Patient unfortunately with history of osteonecrosis of the left hip status post replacement with localized arthritis in the right hip and now with multilevel severe stenosis from L3-L5.  Will likely at some point need spine surgery referral for decompression laminectomy.  Multilevel degenerative changes.  Patient reports pain relief with pain pills and alcohol.   ROS Otherwise per HPI.  Assessment & Plan: Visit Diagnoses:  1. Lumbar radiculopathy   2. Spinal stenosis of lumbar region with neurogenic claudication     Plan: No additional findings.   Meds & Orders:  Meds ordered this encounter  Medications  . methylPREDNISolone acetate (DEPO-MEDROL) injection 80 mg    Orders Placed This Encounter  Procedures  . XR C-ARM NO REPORT  . Epidural Steroid injection    Follow-up: Return for visit to requesting physician as needed.   Procedures: No procedures performed  Lumbar Epidural Steroid Injection - Interlaminar Approach with Fluoroscopic Guidance  Patient: Nathaniel [name]      Date of Birth: 1961-06-01 MRN: [id number] PCP: Marden Noble, MD      Visit Date: 03/26/2020   Universal Protocol:     Consent Given By: the  patient  Position: PRONE  Additional Comments: Vital signs were monitored before and after the procedure. Patient was prepped and draped in the usual sterile fashion. The correct patient, procedure, and site was verified.   Injection Procedure Details:  Procedure Site One Meds Administered:  Meds ordered this encounter  Medications  . methylPREDNISolone acetate (DEPO-MEDROL) injection 80 mg     Laterality: Right  Location/Site:  L5-S1  Needle size: 20 G  Needle type: Tuohy  Needle Placement: Paramedian epidural  Findings:   -Comments: Excellent flow of contrast into the epidural space.  Procedure Details: Using a paramedian approach from the side mentioned above, the region overlying the inferior lamina was localized under fluoroscopic visualization and the soft tissues overlying this structure were infiltrated with 4 ml. of 1% Lidocaine without Epinephrine. The Tuohy needle was inserted into the epidural space using a paramedian approach.   The epidural space was localized using loss of resistance along with lateral and bi-planar fluoroscopic views.  After negative aspirate for air, blood, and CSF, a 2 ml. volume of Isovue-250 was injected into the epidural space and the flow of contrast was observed. Radiographs were obtained for documentation purposes.    The injectate was administered into the level noted above.   Additional Comments:  The patient tolerated the procedure well Dressing: 2 x 2 sterile gauze and Band-Aid    Post-procedure details: Patient was observed during the procedure. Post-procedure instructions were reviewed.  Patient left the clinic in stable condition.     Clinical History: Low back pain radiating to the  bilateral hips  EXAM: CT LUMBAR SPINE WITHOUT CONTRAST  TECHNIQUE: Multidetector CT imaging of the lumbar spine was performed without intravenous contrast administration. Multiplanar CT image reconstructions were also  generated.  COMPARISON:  Radiograph 05/18/2014, CT abdomen pelvis 05/18/2014  FINDINGS: Segmentation: 5 normally formed lumbar type vertebral bodies.  Alignment: Levocurvature of the lumbar spine apex L3. 2 mm retrolisthesis L1 on L2. No other significant spondylolisthesis or visible spondylolysis.  Vertebrae: No acute fracture or vertebral body height loss is evident. Multilevel discogenic and facet degenerative changes are present. Mild anterior wedging of the T12 and L1 vertebral levels is unchanged from prior radiograph from 2015. Some degenerative height loss in the T11 vertebral body similar to prior as well. Multilevel discogenic changes, as detailed below. No worrisome osseous lesions. Multilevel Schmorl's node formations are present. Included portions of the bony pelvis are free of acute abnormality. Mild degenerative changes in the SI joints. Stable sclerotic focus in the posterior left ilium.  Paraspinal and other soft tissues: No paravertebral fluid, swelling, gas or hemorrhage. Evaluation of the posterior abdomen and pelvis as included within the level of imaging is unremarkable. Mild posterior body wall edema. Atherosclerotic calcifications within the abdominal aorta and branch vessels. No aneurysm or ectasia.  Disc levels:  Level by level evaluation of the lumbar spine below:  T11-T12: Disc height loss with calcified left central/subarticular disc protrusion. Mild facet arthropathy. No significant resulting canal stenosis or foraminal narrowing.  T12-L1: Mild disc height loss with global disc bulge and anterior spurring. Mild facet arthropathy. No significant canal stenosis or foraminal narrowing.  L1-L2: 2 mm retrolisthesis. Mild disc height loss with global disc bulge and mild bilateral facet arthropathy. No significant resulting stenosis. Mild bilateral foraminal narrowing.  L2-L3: Minimal disc height loss. Asymmetric disc bulging from  the central to extraforaminal zone on the left. Mild resulting canal stenosis and left foraminal narrowing. No significant right foraminal narrowing.  L3-L4: Mild disc height loss with global disc bulge slightly asymmetric to the left foraminal zone. Mild bilateral facet arthropathy. Congenitally shortened appearance of the pedicles. Moderate canal stenosis and bilateral foraminal narrowing.  L4-L5: Mild disc height loss with partially calcified global disc bulge and moderate facet arthropathy with findings accentuated by congenitally shortened appearance of the pedicles resulting in moderate to severe canal stenosis and moderate bilateral foraminal narrowing as well as near complete effacement of the lateral recesses. Vacuum phenomenon of the facets, could suggest some hypermobility.  L5-S1: Mild disc height loss with global disc bulge with moderate bilateral facet arthropathy. No significant resulting canal stenosis. Moderate bilateral foraminal narrowing and lateral recess stenosis.  IMPRESSION: 1. No acute fracture or traumatic listhesis of the lumbar spine. 2. Levocurvature of the lumbar spine apex L3. Unchanged 2 mm retrolisthesis L1 on L2. No spondylolysis. 3. Multilevel discogenic and facet degenerative changes of the lumbar spine as described above, with findings accentuated by congenitally shortened appearance of the pedicles. Maximal changes result in moderate to severe canal stenosis, foraminal narrowing and lateral recess effacement L3-S1, as detailed level by level above. 4. Mild bilateral SI joint arthrosis. 5.  Aortic Atherosclerosis (ICD10-I70.0).   Electronically Signed   By: Kreg Shropshire M.D.   On: 02/26/2020 20:11   He reports that he has been smoking e-cigarettes. He has smoked for the past 20.00 years. He has never used smokeless tobacco. No results for input(s): HGBA1C, LABURIC in the last 8760 hours.  Objective:  VS:  HT:    WT:   BMI:  BP:(!) 159/95  HR:(!) 134bpm  TEMP: ( )  RESP:  Physical Exam Constitutional:      General: He is not in acute distress.    Appearance: Normal appearance. He is obese. He is not ill-appearing.  HENT:     Head: Normocephalic and atraumatic.     Right Ear: External ear normal.     Left Ear: External ear normal.  Eyes:     Extraocular Movements: Extraocular movements intact.  Cardiovascular:     Rate and Rhythm: Normal rate.     Pulses: Normal pulses.  Abdominal:     General: There is no distension.     Palpations: Abdomen is soft.  Musculoskeletal:        General: No tenderness or signs of injury.     Right lower leg: No edema.     Left lower leg: No edema.     Comments: Patient has good distal strength without clonus.  Skin:    Findings: No erythema or rash.  Neurological:     General: No focal deficit present.     Mental Status: He is alert and oriented to person, place, and time.     Sensory: No sensory deficit.     Motor: No weakness or abnormal muscle tone.     Coordination: Coordination normal.  Psychiatric:        Mood and Affect: Mood normal.        Behavior: Behavior normal.     Ortho Exam  Imaging: No results found.  Past Medical/Family/Surgical/Social History: Medications & Allergies reviewed per EMR, new medications updated. Patient Active Problem List   Diagnosis Date Noted  . Primary localized osteoarthritis of right hip 09/01/2017  . Post-traumatic arthritis of right ankle 02/07/2017  . Osteonecrosis of left hip (HCC) 06/10/2014   Past Medical History:  Diagnosis Date  . Arthritis   . Hx MRSA infection 2004   in the  foot   History reviewed. No pertinent family history. Past Surgical History:  Procedure Laterality Date  . FOOT SURGERY    . FOOT SURGERY Right    has a steele rod  . HAND FUSION Right 1988  . TOTAL HIP ARTHROPLASTY Left 06/10/2014   Procedure: LEFT TOTAL HIP ARTHROPLASTY ANTERIOR APPROACH;  Surgeon: Cheral Almas, MD;   Location: [facility] OR;  Service: Orthopedics;  Laterality: Left;  . WRIST SURGERY     Social History   Occupational History  . Not on file  Tobacco Use  . Smoking status: Current Every Day Smoker    Years: 20.00    Types: E-cigarettes  . Smokeless tobacco: Never Used  Substance and Sexual Activity  . Alcohol use: No  . Drug use: No  . Sexual activity: Not on file

## 2020-04-03 NOTE — Procedures (Signed)
Lumbar Epidural Steroid Injection - Interlaminar Approach with Fluoroscopic Guidance  Patient: Nathaniel [name]      Date of Birth: 1960/11/01 MRN: [id number] PCP: Marden Noble, MD      Visit Date: 03/26/2020   Universal Protocol:     Consent Given By: the patient  Position: PRONE  Additional Comments: Vital signs were monitored before and after the procedure. Patient was prepped and draped in the usual sterile fashion. The correct patient, procedure, and site was verified.   Injection Procedure Details:  Procedure Site One Meds Administered:  Meds ordered this encounter  Medications  . methylPREDNISolone acetate (DEPO-MEDROL) injection 80 mg     Laterality: Right  Location/Site:  L5-S1  Needle size: 20 G  Needle type: Tuohy  Needle Placement: Paramedian epidural  Findings:   -Comments: Excellent flow of contrast into the epidural space.  Procedure Details: Using a paramedian approach from the side mentioned above, the region overlying the inferior lamina was localized under fluoroscopic visualization and the soft tissues overlying this structure were infiltrated with 4 ml. of 1% Lidocaine without Epinephrine. The Tuohy needle was inserted into the epidural space using a paramedian approach.   The epidural space was localized using loss of resistance along with lateral and bi-planar fluoroscopic views.  After negative aspirate for air, blood, and CSF, a 2 ml. volume of Isovue-250 was injected into the epidural space and the flow of contrast was observed. Radiographs were obtained for documentation purposes.    The injectate was administered into the level noted above.   Additional Comments:  The patient tolerated the procedure well Dressing: 2 x 2 sterile gauze and Band-Aid    Post-procedure details: Patient was observed during the procedure. Post-procedure instructions were reviewed.  Patient left the clinic in stable condition.

## 2020-05-03 ENCOUNTER — Emergency Department (HOSPITAL_COMMUNITY)
Admission: EM | Admit: 2020-05-03 | Discharge: 2020-05-03 | Disposition: A | Discharge status: Home / Self Care | Payer: Medicare Other | Attending: Emergency Medicine | Admitting: Emergency Medicine

## 2020-05-03 ENCOUNTER — Encounter (HOSPITAL_COMMUNITY): Payer: Self-pay

## 2020-05-03 ENCOUNTER — Emergency Department (HOSPITAL_COMMUNITY): Payer: Medicare Other

## 2020-05-03 ENCOUNTER — Other Ambulatory Visit: Payer: Self-pay

## 2020-05-03 DIAGNOSIS — Z20822 Contact with and (suspected) exposure to covid-19: Secondary | ICD-10-CM | POA: Diagnosis not present

## 2020-05-03 DIAGNOSIS — F17290 Nicotine dependence, other tobacco product, uncomplicated: Secondary | ICD-10-CM | POA: Diagnosis not present

## 2020-05-03 DIAGNOSIS — R05 Cough: Secondary | ICD-10-CM | POA: Insufficient documentation

## 2020-05-03 DIAGNOSIS — I10 Essential (primary) hypertension: Secondary | ICD-10-CM | POA: Insufficient documentation

## 2020-05-03 DIAGNOSIS — Z79899 Other long term (current) drug therapy: Secondary | ICD-10-CM | POA: Diagnosis not present

## 2020-05-03 DIAGNOSIS — Z96642 Presence of left artificial hip joint: Secondary | ICD-10-CM | POA: Insufficient documentation

## 2020-05-03 DIAGNOSIS — L03115 Cellulitis of right lower limb: Secondary | ICD-10-CM

## 2020-05-03 DIAGNOSIS — R000 Tachycardia, unspecified: Secondary | ICD-10-CM | POA: Diagnosis not present

## 2020-05-03 LAB — CBC WITH DIFFERENTIAL/PLATELET
Abs Immature Granulocytes: 0.02 10*3/uL (ref 0.00–0.07)
Basophils Absolute: 0 10*3/uL (ref 0.0–0.1)
Basophils Relative: 1 %
Eosinophils Absolute: 0 10*3/uL (ref 0.0–0.5)
Eosinophils Relative: 1 %
HCT: 41.7 % (ref 39.0–52.0)
Hemoglobin: 15 g/dL (ref 13.0–17.0)
Immature Granulocytes: 1 %
Lymphocytes Relative: 31 %
Lymphs Abs: 1.1 10*3/uL (ref 0.7–4.0)
MCH: 32.6 pg (ref 26.0–34.0)
MCHC: 36 g/dL (ref 30.0–36.0)
MCV: 90.7 fL (ref 80.0–100.0)
Monocytes Absolute: 0.8 10*3/uL (ref 0.1–1.0)
Monocytes Relative: 23 %
Neutro Abs: 1.5 10*3/uL — ABNORMAL LOW (ref 1.7–7.7)
Neutrophils Relative %: 43 %
Platelets: 184 10*3/uL (ref 150–400)
RBC: 4.6 MIL/uL (ref 4.22–5.81)
RDW: 14.3 % (ref 11.5–15.5)
WBC: 3.4 10*3/uL — ABNORMAL LOW (ref 4.0–10.5)
nRBC: 0 % (ref 0.0–0.2)

## 2020-05-03 LAB — RESP PANEL BY RT PCR (RSV, FLU A&B, COVID)
Influenza A by PCR: NEGATIVE
Influenza B by PCR: NEGATIVE
Respiratory Syncytial Virus by PCR: NEGATIVE
SARS Coronavirus 2 by RT PCR: NEGATIVE

## 2020-05-03 LAB — COMPREHENSIVE METABOLIC PANEL
ALT: 40 U/L (ref 0–44)
AST: 51 U/L — ABNORMAL HIGH (ref 15–41)
Albumin: 4 g/dL (ref 3.5–5.0)
Alkaline Phosphatase: 106 U/L (ref 38–126)
Anion gap: 14 (ref 5–15)
BUN: 9 mg/dL (ref 6–20)
CO2: 28 mmol/L (ref 22–32)
Calcium: 8.9 mg/dL (ref 8.9–10.3)
Chloride: 88 mmol/L — ABNORMAL LOW (ref 98–111)
Creatinine, Ser: 0.88 mg/dL (ref 0.61–1.24)
GFR calc Af Amer: >60 mL/min (ref >60)
GFR calc non Af Amer: >60 mL/min (ref >60)
Glucose, Bld: 120 mg/dL — ABNORMAL HIGH (ref 70–99)
Potassium: 3.5 mmol/L (ref 3.5–5.1)
Sodium: 130 mmol/L — ABNORMAL LOW (ref 135–145)
Total Bilirubin: 0.5 mg/dL (ref 0.3–1.2)
Total Protein: 7.7 g/dL (ref 6.5–8.1)

## 2020-05-03 MED ORDER — CLINDAMYCIN PHOSPHATE 600 MG/50ML IV SOLN
600.0000 mg | Freq: Once | INTRAVENOUS | Status: AC
  Administered 2020-05-03: 600 mg via INTRAVENOUS
  Filled 2020-05-03: qty 50

## 2020-05-03 MED ORDER — IBUPROFEN 800 MG PO TABS
800.0000 mg | ORAL_TABLET | Freq: Once | ORAL | Status: AC
  Administered 2020-05-03: 800 mg via ORAL
  Filled 2020-05-03: qty 1

## 2020-05-03 MED ORDER — CLINDAMYCIN HCL 150 MG PO CAPS
450.0000 mg | ORAL_CAPSULE | Freq: Three times a day (TID) | ORAL | 0 refills | Status: AC
Start: 2020-05-03 — End: 2020-05-10

## 2020-05-03 NOTE — Discharge Instructions (Addendum)
Seen for cellulitis.  Started you on antibiotics, please take as prescribed.  Recommend keeping your legs elevated and applying compression socks to help with swelling.  I recommend taking over-the-counter ibuprofen or Tylenol every 6 as needed for pain.  Please follow dosing back of bottle.  I want you to follow-up with your primary care provider for further evaluation in 7 days.  Come back to the emergency department if you have worsening leg swelling, worsening redness, pain, fever, chills, shortness of breath, chest pain, abdominal pain, uncontrolled nausea, vomiting, diarrhea.

## 2020-05-03 NOTE — ED Provider Notes (Signed)
[facility]-EMERGENCY DEPT Provider Note   CSN: [id number] Arrival date & time: 05/03/20  1319     History No chief complaint on file.   Nathaniel [name] is a 59 y.o. male.  HPI   Patient with significant medical history of hypertension, arthritis, MRSA infection presents to emergency department with chief complaint of left lower leg infection. Patient states he first noticed a wound on the medial aspect of his lower leg about 3 months ago. The wound started to get larger and started to hurt, he spoke with his primary care provider who started him on Augmentin on 09/01. He stopped the medications as he was having GI problems. Patient admits that his leg has become more red and swollen, noticing drainage as well as a foul-smelling odor. He admits to subjective fevers and chills as well as a nonproductive cough. He is Covid vaccinated, denies recent sick contacts or recent travels. Patient denies headache, chest pain, shortness of breath, abdominal pain, nausea, vomiting, diarrhea, dysuria, worsening pedal edema.  Past Medical History:  Diagnosis Date  . Arthritis   . Hx MRSA infection 2004   in the  foot    Patient Active Problem List   Diagnosis Date Noted  . Primary localized osteoarthritis of right hip 09/01/2017  . Post-traumatic arthritis of right ankle 02/07/2017  . Osteonecrosis of left hip (HCC) 06/10/2014    Past Surgical History:  Procedure Laterality Date  . FOOT SURGERY    . FOOT SURGERY Right    has a steele rod  . HAND FUSION Right 1988  . TOTAL HIP ARTHROPLASTY Left 06/10/2014   Procedure: LEFT TOTAL HIP ARTHROPLASTY ANTERIOR APPROACH;  Surgeon: Cheral Almas, MD;  Location: [facility] OR;  Service: Orthopedics;  Laterality: Left;  . WRIST SURGERY         History reviewed. No pertinent family history.  Social History   Tobacco Use  . Smoking status: Current Every Day Smoker    Years: 20.00    Types: E-cigarettes  . Smokeless tobacco: Never  Used  Substance Use Topics  . Alcohol use: No  . Drug use: No    Home Medications Prior to Admission medications   Medication Sig Start Date End Date Taking? Authorizing Provider  amLODipine (NORVASC) 5 MG tablet Take 5 mg by mouth daily with supper.   Yes [provider]  Ascorbic Acid (VITAMIN C) 1000 MG tablet Take 1,000 mg by mouth daily.   Yes [provider]  ibuprofen (ADVIL) 200 MG tablet Take 400-600 mg by mouth every 6 (six) hours as needed for fever, headache or moderate pain.   Yes [provider]  LORazepam (ATIVAN) 2 MG tablet Take 2 mg by mouth at bedtime as needed for sleep or anxiety. 02/26/20  Yes [provider]  Multiple Vitamin (MULTIVITAMIN WITH MINERALS) TABS tablet Take 1 tablet by mouth daily.   Yes [provider]  omeprazole (PRILOSEC) 20 MG capsule Take 20 mg by mouth daily with supper.    Yes [provider]  SYMBICORT 160-4.5 MCG/ACT inhaler Inhale 2 puffs into the lungs 2 (two) times daily as needed for wheezing or shortness of breath. 04/30/20  Yes [provider]  traMADol (ULTRAM) 50 MG tablet Take 50 mg by mouth every 6 (six) hours as needed for moderate pain.    Yes [provider]  amoxicillin-clavulanate (AUGMENTIN) 875-125 MG tablet Take 1 tablet by mouth 2 (two) times daily. 10 day supply 04/09/20   [provider]  clindamycin (CLEOCIN) 150 MG capsule Take 3 capsules (450 mg total) by mouth 3 (three) times daily for 7 days. 05/03/20 05/10/20  Carroll Sage, PA-C  methocarbamol (ROBAXIN) 500 MG tablet Take 1 tablet (500 mg total) by mouth 2 (two) times daily as needed. Patient not taking: Reported on 05/03/2020 03/19/20   [name], PA-C  predniSONE (STERAPRED UNI-PAK 21 TAB) 10 MG (21) TBPK tablet Take as directed Patient not taking: Reported on 05/03/2020 03/19/20   [name], PA-C    Allergies    Sulfa antibiotics  Review of Systems   Review of Systems    Constitutional: Positive for chills and fever.  HENT: Negative for congestion, tinnitus and trouble swallowing.   Respiratory: Positive for cough. Negative for shortness of breath.   Cardiovascular: Negative for chest pain and palpitations.  Gastrointestinal: Negative for abdominal pain, diarrhea, nausea and vomiting.  Genitourinary: Negative for enuresis, flank pain, frequency and genital sores.  Musculoskeletal: Negative for back pain.       Right leg pain.   Skin: Negative for rash.  Neurological: Negative for dizziness and headaches.  Hematological: Does not bruise/bleed easily.    Physical Exam Updated Vital Signs BP 131/76   Pulse (!) 104   Temp 98.6 F (37 C) (Rectal)   Resp 18   Ht 6\' 2"  (1.88 m)   Wt 122.5 kg   SpO2 94%   BMI 34.67 kg/m   Physical Exam Vitals and nursing note reviewed.  Constitutional:      General: He is not in acute distress.    Appearance: He is not ill-appearing.  HENT:     Head: Normocephalic and atraumatic.     Nose: No congestion.     Mouth/Throat:     Mouth: Mucous membranes are moist.     Pharynx: Oropharynx is clear. No oropharyngeal exudate or posterior oropharyngeal erythema.  Eyes:     General: No scleral icterus. Cardiovascular:     Rate and Rhythm: Regular rhythm. Tachycardia present.     Pulses: Normal pulses.     Heart sounds: No murmur heard.  No friction rub. No gallop.   Pulmonary:     Effort: No respiratory distress.     Breath sounds: No wheezing, rhonchi or rales.  Abdominal:     General: There is no distension.     Palpations: Abdomen is soft.     Tenderness: There is no abdominal tenderness. There is no right CVA tenderness, left CVA tenderness or guarding.  Musculoskeletal:        General: No swelling.     Right lower leg: Edema present.     Left lower leg: Edema present.     Comments: Patient has bilateral pedal edema 2+ up to the shins. Neurovascular fully intact.  Skin:    General: Skin is warm and dry.      Findings: Erythema, lesion and rash present.     Comments: Right leg has chronic lymphedema changes, ulcer noted on the distal anterior aspect of the tibia, erythema surrounding the wound, warm to the touch, drainage noted. It was tender to palpation, no fluctuance or indurations felt. Neurovascular fully intact bilaterally  Neurological:     Mental Status: He is alert.  Psychiatric:        Mood and Affect: Mood normal.            ED Results / Procedures / Treatments   Labs (all labs ordered are listed, but only abnormal results  are displayed) Labs Reviewed  COMPREHENSIVE METABOLIC PANEL - Abnormal; Notable for the following components:      Result Value   Sodium 130 (*)    Chloride 88 (*)    Glucose, Bld 120 (*)    AST 51 (*)    All other components within normal limits  CBC WITH DIFFERENTIAL/PLATELET - Abnormal; Notable for the following components:   WBC 3.4 (*)    Neutro Abs 1.5 (*)    All other components within normal limits  RESP PANEL BY RT PCR (RSV, FLU A&B, COVID)    EKG None  Radiology DG Chest Port 1 View  Result Date: 05/03/2020 CLINICAL DATA:  Cough EXAM: PORTABLE CHEST 1 VIEW COMPARISON:  12/09/2016 FINDINGS: Cardiac shadows within normal limits. Old rib fractures are noted involving the left seventh and eighth ribs laterally. These are of uncertain chronicity. No pneumothorax is seen. Mild pleural thickening is noted adjacent to the fractures. IMPRESSION: Rib fractures on the left of uncertain chronicity. Mild associated pleural thickening is noted suggesting a more chronic nature. Electronically Signed   By: Alcide Clever M.D.   On: 05/03/2020 14:55    Procedures Procedures (including critical care time)  Medications Ordered in ED Medications  clindamycin (CLEOCIN) IVPB 600 mg (600 mg Intravenous New Bag/Given 05/03/20 1533)  ibuprofen (ADVIL) tablet 800 mg (800 mg Oral Given 05/03/20 1531)    ED Course  I have reviewed the triage vital signs and  the nursing notes.  Pertinent labs & imaging results that were available during my care of the patient were reviewed by me and considered in my medical decision making (see chart for details).    MDM Rules/Calculators/A&P                          Patient presents with cellulitis of the right leg.  He was alert, did not appear in acute distress, vital signs significant for tachycardia.  Will order screening labs, chest x-ray and start patient on IV antibiotics.  Patient CMP significant for hyponatremia of 130, hyperglycemia of 130, no metabolic acidosis, no signs of DKA, no anion gap.  CBC negative for leukocytosis or anemia.  Respiratory panel negative for Covid, influenza A/B, RSV.  Chest x-ray significant for rib fracture on the left uncertain of acute or chronic, mild pleural thickening appears to be more chronic.  I have low suspicion for systemic infection as patient is nontoxic-appearing, vital signs reassuring, no source of infection noted on exam.  I suspect initial tachycardia is from pain as well as anxiety being in the emergency department.  Low suspicion for deep tissue infection or necrotizing fasciitis as there is no induration or fluctuance felt on exam, there is no signs of necrotic skin on exam.  Low suspicion patient have to be admitted for cellulitis as he does not meet sepsis criteria, no leukocytosis noted on CBC, patient did not fail outpatient therapy.  Patient states he only took a few of his Augmentin and had to stop due to GI issues.  Will start patient on IV antibiotics and discharge him home on clinda to cover him for MRSA.  Recommend he follows up with his primary care provider for close follow-up.  Vital signs have remained stable, no indication for hospital admission.  Patient was discussed with attending who evaluated patient agrees assessment plan.  Patient given at home care as well as strict return precautions.  Patient verbalized understood agreed to plan. Final  Clinical Impression(s) / ED Diagnoses Final diagnoses:  Cellulitis of right lower extremity    Rx / DC Orders ED Discharge Orders         Ordered    clindamycin (CLEOCIN) 150 MG capsule  3 times daily        05/03/20 1556           Barnie DelFaulkner, Berthe Oley J, PA-C 05/03/20 1603    Geoffery Lyonselo, [place], MD 05/04/20 1347

## 2020-05-03 NOTE — ED Triage Notes (Signed)
Pt BIB EMS from home. Pt has cellulitis to right leg that is red, swollen, and oozing. Pt reports taking antibiotics, but stopped about a week ago because they made his stomach upset. Cellulitis has become worse since.   BP 140/90 HR 114 SpO2 94% CBG 174 Temp 99.6

## 2020-06-24 ENCOUNTER — Ambulatory Visit (INDEPENDENT_AMBULATORY_CARE_PROVIDER_SITE_OTHER): Payer: Medicare Other | Admitting: Orthopaedic Surgery

## 2020-06-24 ENCOUNTER — Ambulatory Visit: Payer: Self-pay

## 2020-06-24 ENCOUNTER — Encounter: Payer: Self-pay | Admitting: Orthopaedic Surgery

## 2020-06-24 VITALS — Ht 73.0 in | Wt 279.0 lb

## 2020-06-24 DIAGNOSIS — M87051 Idiopathic aseptic necrosis of right femur: Secondary | ICD-10-CM

## 2020-06-24 NOTE — Progress Notes (Signed)
Office Visit Note   Patient: Nathaniel [name]           Date of Birth: [birth year]           MRN: [id number] Visit Date: 06/24/2020              Requested by: Marden Noble, MD [address] [place],  [place] [address] PCP: Marden Noble, MD   Assessment & Plan: Visit Diagnoses:  1. Avascular necrosis of bone of right hip (HCC)     Plan: Impression is severe right hip avascular necrosis with femoral head collapse.  At this point I do not feel comfortable proceeding with a total hip replacement given how the lower extremities look with the massive pitting edema and erythema.  We have provided him with a pair TED hose that he needs to wear at all times except while at night and bathing.  I would like to recheck his legs in 2 weeks.  He understands that we cannot proceed with elective total hip replacement until his swelling has significantly improved to the point where cellulitis is no longer a concern.  Follow-Up Instructions: Return in about 2 weeks (around 07/08/2020).   Orders:  Orders Placed This Encounter  Procedures  . XR HIP UNILAT W OR W/O PELVIS 2-3 VIEWS RIGHT   No orders of the defined types were placed in this encounter.     Procedures: No procedures performed   Clinical Data: No additional findings.   Subjective: Chief Complaint  Patient presents with  . Right Hip - Pain    [name] a 59 year old gentleman who comes in for severe right hip pain.  Ambulating with a single point cane.  He is unable to find relief and injections or medications.  He is currently on disability.  He does admit to drinking scotch regularly.  He recently has had several bouts of lower extremity cellulitis and pitting edema.  He has been off of antibiotics for about 5 days.  He has been noncompliant with TED hose use.  Denies any fevers chills or constitutional symptoms.   Review of Systems  Constitutional: Negative.   All other systems reviewed and are  negative.    Objective: Vital Signs: Ht 6\' 1"  (1.854 m)   Wt 279 lb (126.6 kg)   BMI 36.81 kg/m   Physical Exam Vitals and nursing note reviewed.  Constitutional:      Appearance: He is well-developed.  Pulmonary:     Effort: Pulmonary effort is normal.  Abdominal:     Palpations: Abdomen is soft.  Skin:    General: Skin is warm.  Neurological:     Mental Status: He is alert and oriented to person, place, and time.  Psychiatric:        Behavior: Behavior normal.        Thought Content: Thought content normal.        Judgment: Judgment normal.     Ortho Exam Antalgic gait.  Painful range of motion of the right hip.  Leg length discrepancy of the right leg.  He Has pitting edema and erythema of bilateral lower extremities worse on the right.  He has multiple healed scratches. Specialty Comments:  No specialty comments available.  Imaging: XR HIP UNILAT W OR W/O PELVIS 2-3 VIEWS RIGHT  Result Date: 06/24/2020 Stable left total hip replacement.  Significant collapse of right femoral head due to avascular necrosis.    PMFS History: Patient Active Problem List   Diagnosis Date Noted  .  Primary localized osteoarthritis of right hip 09/01/2017  . Post-traumatic arthritis of right ankle 02/07/2017  . Osteonecrosis of left hip (HCC) 06/10/2014   Past Medical History:  Diagnosis Date  . Arthritis   . Hx MRSA infection 2004   in the  foot    History reviewed. No pertinent family history.  Past Surgical History:  Procedure Laterality Date  . FOOT SURGERY    . FOOT SURGERY Right    has a steele rod  . HAND FUSION Right 1988  . TOTAL HIP ARTHROPLASTY Left 06/10/2014   Procedure: LEFT TOTAL HIP ARTHROPLASTY ANTERIOR APPROACH;  Surgeon: Cheral Almas, MD;  Location: [facility] OR;  Service: Orthopedics;  Laterality: Left;  . WRIST SURGERY     Social History   Occupational History  . Not on file  Tobacco Use  . Smoking status: Current Every Day Smoker    Years:  20.00    Types: E-cigarettes  . Smokeless tobacco: Never Used  Substance and Sexual Activity  . Alcohol use: No  . Drug use: No  . Sexual activity: Not on file

## 2020-07-08 ENCOUNTER — Ambulatory Visit (INDEPENDENT_AMBULATORY_CARE_PROVIDER_SITE_OTHER): Payer: Medicare Other | Admitting: Orthopaedic Surgery

## 2020-07-08 ENCOUNTER — Encounter: Payer: Self-pay | Admitting: Orthopaedic Surgery

## 2020-07-08 ENCOUNTER — Other Ambulatory Visit: Payer: Self-pay

## 2020-07-08 VITALS — Ht 73.0 in | Wt 275.8 lb

## 2020-07-08 DIAGNOSIS — M87051 Idiopathic aseptic necrosis of right femur: Secondary | ICD-10-CM | POA: Diagnosis not present

## 2020-07-08 NOTE — Progress Notes (Signed)
   Office Visit Note   Patient: Nathaniel [name]           Date of Birth: [birth year]           MRN: [id number] Visit Date: 07/08/2020              Requested by: Marden Noble, MD [address] [place],  [place] [address] PCP: Marden Noble, MD   Assessment & Plan: Visit Diagnoses:  1. Avascular necrosis of bone of right hip (HCC)     Plan: From my standpoint I think he needs formal evaluation by vascular surgery as to why he acutely developed such bad pitting edema and venous stasis just in the last month without any apparent reasons.  At this point I do not feel that it is appropriate to perform elective total hip replacement given the risk of complications such as infection and DVT.  This will need to be optimized before we can consider surgery.  Follow-Up Instructions: Return if symptoms worsen or fail to improve.   Orders:  No orders of the defined types were placed in this encounter.  No orders of the defined types were placed in this encounter.     Procedures: No procedures performed   Clinical Data: No additional findings.   Subjective: No chief complaint on file.   Nathaniel [name] comes back today for recheck of his venous stasis dermatitis.  He has been off antibiotics for couple weeks.   Review of Systems   Objective: Vital Signs: Ht 6\' 1"  (1.854 m)   Wt 275 lb 12.8 oz (125.1 kg)   BMI 36.39 kg/m   Physical Exam  Ortho Exam His venous stasis and pitting edema have improved but he still has significant amount of dermatitis in the right lower extremity.  The left lung actually looks pretty good. Specialty Comments:  No specialty comments available.  Imaging: No results found.   PMFS History: Patient Active Problem List   Diagnosis Date Noted  . Primary localized osteoarthritis of right hip 09/01/2017  . Post-traumatic arthritis of right ankle 02/07/2017  . Osteonecrosis of left hip (HCC) 06/10/2014   Past Medical History:  Diagnosis Date  .  Arthritis   . Hx MRSA infection 2004   in the  foot    History reviewed. No pertinent family history.  Past Surgical History:  Procedure Laterality Date  . FOOT SURGERY    . FOOT SURGERY Right    has a steele rod  . HAND FUSION Right 1988  . TOTAL HIP ARTHROPLASTY Left 06/10/2014   Procedure: LEFT TOTAL HIP ARTHROPLASTY ANTERIOR APPROACH;  Surgeon: 13/09/2013, MD;  Location: [facility] OR;  Service: Orthopedics;  Laterality: Left;  . WRIST SURGERY     Social History   Occupational History  . Not on file  Tobacco Use  . Smoking status: Current Every Day Smoker    Years: 20.00    Types: E-cigarettes  . Smokeless tobacco: Never Used  Substance and Sexual Activity  . Alcohol use: No  . Drug use: No  . Sexual activity: Not on file

## 2020-07-15 ENCOUNTER — Encounter (HOSPITAL_COMMUNITY): Payer: Medicare Other

## 2020-07-16 ENCOUNTER — Other Ambulatory Visit: Payer: Self-pay

## 2020-07-16 ENCOUNTER — Ambulatory Visit (INDEPENDENT_AMBULATORY_CARE_PROVIDER_SITE_OTHER): Payer: Medicare Other | Admitting: Physician Assistant

## 2020-07-16 ENCOUNTER — Ambulatory Visit (HOSPITAL_COMMUNITY)
Admission: RE | Admit: 2020-07-16 | Discharge: 2020-07-16 | Disposition: A | Discharge status: Home / Self Care | Payer: Medicare Other | Source: Ambulatory Visit | Attending: Physician Assistant | Admitting: Physician Assistant

## 2020-07-16 ENCOUNTER — Other Ambulatory Visit (HOSPITAL_COMMUNITY): Payer: Self-pay | Admitting: Emergency Medicine

## 2020-07-16 VITALS — BP 141/79 | HR 76 | Temp 97.2°F | Resp 18 | Ht 73.0 in | Wt 254.8 lb

## 2020-07-16 DIAGNOSIS — I872 Venous insufficiency (chronic) (peripheral): Secondary | ICD-10-CM

## 2020-07-16 DIAGNOSIS — L02419 Cutaneous abscess of limb, unspecified: Secondary | ICD-10-CM

## 2020-07-16 DIAGNOSIS — L03119 Cellulitis of unspecified part of limb: Secondary | ICD-10-CM | POA: Diagnosis present

## 2020-07-16 DIAGNOSIS — M7989 Other specified soft tissue disorders: Secondary | ICD-10-CM

## 2020-07-16 NOTE — Progress Notes (Signed)
[facility] [place]   Reason for referral: Swollen right leg  History of Present Illness  Nathaniel [name] is a 59 y.o. male who presents with chief complaint: swollen right leg.  He has a history of avascular necrosis of the left his and under went left THA by Dr. Roda Shutters.  He now has right AVN and the surgery has been postponed  secondary to edema and cellulitis of the left LE.  He has been referred for evaluation of his left LE.  He has been placed in TED hose, but has not been compliant wearing them.  He has completed oral antibiotics for the cellulitis and it is improving.    Patient notes, onset of swelling 3-4 months ago, associated with prolonged sitting and standing.  The patient has had no history of DVT, no history of varicose vein, no history of venous stasis ulcers, no history of  Lymphedema and positive cellulitis and weeping history of skin changes in lower legs.  There is no family history of venous disorders.  The patient has  used TED hose in the recent past which seems to be helping with the swelling and cellulitis.    Past Medical History:  Diagnosis Date  . Arthritis   . Hx MRSA infection 2004   in the  foot    Past Surgical History:  Procedure Laterality Date  . FOOT SURGERY    . FOOT SURGERY Right    has a steele rod  . HAND FUSION Right 1988  . TOTAL HIP ARTHROPLASTY Left 06/10/2014   Procedure: LEFT TOTAL HIP ARTHROPLASTY ANTERIOR APPROACH;  Surgeon: Cheral Almas, MD;  Location: [facility] OR;  Service: Orthopedics;  Laterality: Left;  . WRIST SURGERY      Social History   Socioeconomic History  . Marital status: Single    Spouse name: Not on file  . Number of children: Not on file  . Years of education: Not on file  . Highest education level: Not on file  Occupational History  . Not on file  Tobacco Use  . Smoking status: Former Smoker    Years: 20.00    Types: E-cigarettes    Quit date: 07/09/2020    Years since quitting: 0.0  .  Smokeless tobacco: Never Used  Substance and Sexual Activity  . Alcohol use: No  . Drug use: No  . Sexual activity: Not on file  Other Topics Concern  . Not on file  Social History Narrative  . Not on file   Social Determinants of Health   Financial Resource Strain:   . Difficulty of Paying Living Expenses: Not on file  Food Insecurity:   . Worried About Programme researcher, broadcasting/film/video in the Last Year: Not on file  . Ran Out of Food in the Last Year: Not on file  Transportation Needs:   . Lack of Transportation (Medical): Not on file  . Lack of Transportation (Non-Medical): Not on file  Physical Activity:   . Days of Exercise per Week: Not on file  . Minutes of Exercise per Session: Not on file  Stress:   . Feeling of Stress : Not on file  Social Connections:   . Frequency of Communication with Friends and Family: Not on file  . Frequency of Social Gatherings with Friends and Family: Not on file  . Attends Religious Services: Not on file  . Active Member of Clubs or Organizations: Not on file  . Attends Banker Meetings: Not on  file  . Marital Status: Not on file  Intimate Partner Violence:   . Fear of Current or Ex-Partner: Not on file  . Emotionally Abused: Not on file  . Physically Abused: Not on file  . Sexually Abused: Not on file    History reviewed. No pertinent family history.  Current Outpatient Medications on File Prior to Visit  Medication Sig Dispense Refill  . amLODipine (NORVASC) 5 MG tablet Take 5 mg by mouth daily with supper.    . Ascorbic Acid (VITAMIN C) 1000 MG tablet Take 1,000 mg by mouth daily.    Marland Kitchen ibuprofen (ADVIL) 200 MG tablet Take 400-600 mg by mouth every 6 (six) hours as needed for fever, headache or moderate pain.    Marland Kitchen LORazepam (ATIVAN) 2 MG tablet Take 2 mg by mouth at bedtime as needed for sleep or anxiety.    . methocarbamol (ROBAXIN) 500 MG tablet Take 1 tablet (500 mg total) by mouth 2 (two) times daily as needed. 20 tablet 0  .  Multiple Vitamin (MULTIVITAMIN WITH MINERALS) TABS tablet Take 1 tablet by mouth daily.    Marland Kitchen omeprazole (PRILOSEC) 20 MG capsule Take 20 mg by mouth daily with supper.     . predniSONE (STERAPRED UNI-PAK 21 TAB) 10 MG (21) TBPK tablet Take as directed 21 tablet 0  . SYMBICORT 160-4.5 MCG/ACT inhaler Inhale 2 puffs into the lungs 2 (two) times daily as needed for wheezing or shortness of breath.    . traMADol (ULTRAM) 50 MG tablet Take 50 mg by mouth every 6 (six) hours as needed for moderate pain.     Marland Kitchen amoxicillin-clavulanate (AUGMENTIN) 875-125 MG tablet Take 1 tablet by mouth 2 (two) times daily. 10 day supply (Patient not taking: Reported on 07/16/2020)     No current facility-administered medications on file prior to visit.    Allergies as of 07/16/2020 - Review Complete 07/16/2020  Allergen Reaction Noted  . Sulfa antibiotics Rash 05/03/2020     ROS:   General:  No weight loss, Fever, chills  HEENT: No recent headaches, no nasal bleeding, no visual changes, no sore throat  Neurologic: No dizziness, blackouts, seizures. No recent symptoms of stroke or mini- stroke. No recent episodes of slurred speech, or temporary blindness.  Cardiac: No recent episodes of chest pain/pressure, no shortness of breath at rest.  No shortness of breath with exertion.  Denies history of atrial fibrillation or irregular heartbeat  Vascular: No history of rest pain in feet.  No history of claudication.  No history of non-healing ulcer, No history of DVT   Pulmonary: No home oxygen, no productive cough, no hemoptysis,  No asthma or wheezing  Musculoskeletal:  [x ] Arthritis, [ ]  Low back pain,  [x ] Joint pain  Hematologic:No history of hypercoagulable state.  No history of easy bleeding.  No history of anemia  Gastrointestinal: No hematochezia or melena,  No gastroesophageal reflux, no trouble swallowing  Urinary: [ ]  chronic Kidney disease, [ ]  on HD - [ ]  MWF or [ ]  TTHS, [ ]  Burning with  urination, [ ]  Frequent urination, [ ]  Difficulty urinating;   Skin: No rashes, right lower leg cellulitis/brawny colored skin  Psychological: No history of anxiety,  No history of depression  Physical Examination  Vitals:   07/16/20 1020  BP: (!) 141/79  Pulse: 76  Resp: 18  Temp: (!) 97.2 F (36.2 C)  TempSrc: Temporal  SpO2: 98%  Weight: 254 lb 12.8 oz (115.6 kg)  Height:  6\' 1"  (1.854 m)    Body mass index is 33.62 kg/m.  General:  Alert and oriented, no acute distress HEENT: Normal Neck: No bruit or JVD Pulmonary: Clear to auscultation bilaterally Cardiac: Regular Rate and Rhythm without murmur Abdomen: Soft, non-tender, non-distended, no mass, no scars Skin: No rash right LE brawny colored skin, slight erythema B LE, no weeping      Extremity Pulses:   radial, brachial, femoral, dorsalis pedis pulses bilaterally Musculoskeletal: positive right > left LE edema edema  Neurologic: Upper and lower extremity motor intact, right hip with painful movement active and passive  DATA: Left hip x ray11/16/2021 Significant collapse of right femoral  head due to avascular necrosis.  Venous reflux study:    Venous Reflux Times  +--------------+---------+------+-----------+------------+--------+  RIGHT     Reflux NoRefluxReflux TimeDiameter cmsComments               Yes                   +--------------+---------+------+-----------+------------+--------+  CFV            yes  >1 second             +--------------+---------+------+-----------+------------+--------+  FV mid    no                         +--------------+---------+------+-----------+------------+--------+  Popliteal         yes  >1 second             +--------------+---------+------+-----------+------------+--------+  GSV at SFJ        yes  >500 ms   0.72         +--------------+---------+------+-----------+------------+--------+  GSV prox thigh      yes  >500 ms   0.64        +--------------+---------+------+-----------+------------+--------+  GSV mid thigh       yes  >500 ms   0.64        +--------------+---------+------+-----------+------------+--------+  GSV dist thighno               0.62        +--------------+---------+------+-----------+------------+--------+  GSV at knee  no               0.62        +--------------+---------+------+-----------+------------+--------+  SSV Pop Fossa no               0.47        +--------------+---------+------+-----------+------------+--------+   Summary:  Right:  - No evidence of deep vein thrombosis from the common femoral through the  popliteal veins.  - No evidence of superficial venous thrombosis.  - The deep venous system is not competent.  - The great saphenous vein is not competent.  - The small saphenous vein is competent.    Assessment: Deep and GSV reflux with symptomatic edema and recent history of right LE cellulitis improving.   He is not at risk of limb loss with palpable pedal pulses.   Right hip sever AVN with plans for THA by DR. Xu.  Plan:  I would allow the cellulitis to continue to improve for a few more weeks then he can have his THA.  I would continue to wear the thigh high compression daily after hip surgery until he f/us in our vein clinic for consideration of laser ablation therapy in 3 months.  He has been fitted with thigh high compression 20-30 mmhg to be worn daily and removed at night for  sleep.  Elevation multiple times a day of B LEs.  Exercises and movement as tolerates of the LE's to pump the venous bleep back to the heart.    Mosetta PigeonEmma Maureen Jahaziel Francois PA-C [facility] [place] Office: [phone]  MD on call Randie HeinzCain

## 2020-07-18 ENCOUNTER — Telehealth: Payer: Self-pay

## 2020-07-18 NOTE — Telephone Encounter (Signed)
patient called he stated he was referred to a vein specialist patient would like a call back from Dr.XU regarding how to the visit went  [phone]

## 2020-07-18 NOTE — Telephone Encounter (Signed)
Please see below and advise.

## 2020-07-19 NOTE — Telephone Encounter (Signed)
Spoke to patient and he will follow up in about 2 weeks

## 2020-07-31 ENCOUNTER — Encounter: Payer: Self-pay | Admitting: Orthopaedic Surgery

## 2020-07-31 ENCOUNTER — Ambulatory Visit (INDEPENDENT_AMBULATORY_CARE_PROVIDER_SITE_OTHER): Payer: Medicare Other | Admitting: Orthopaedic Surgery

## 2020-07-31 VITALS — Ht 73.0 in | Wt 254.0 lb

## 2020-07-31 DIAGNOSIS — M87051 Idiopathic aseptic necrosis of right femur: Secondary | ICD-10-CM | POA: Diagnosis not present

## 2020-07-31 NOTE — Progress Notes (Signed)
   Office Visit Note   Patient: Nathaniel [name]           Date of Birth: 03/11/1961           MRN: [id number] Visit Date: 07/31/2020              Requested by: Marden Noble, MD [address] [place],  [place] [address] PCP: Marden Noble, MD   Assessment & Plan: Visit Diagnoses:  1. Avascular necrosis of bone of right hip (HCC)     Plan: At this point I feel that Clois is ready to undergo a a right total hip replacement.  We again discussed the risk benefits rehab recovery.  He did very well from his previous hip replacement several years ago.  He understands that he needs to wear the compression socks regularly to prevent venous stasis ulcers.  He will speak with Domenica Reamer today on finding surgery date.  Follow-Up Instructions: Return for 2-week postop.   Orders:  Orders Placed This Encounter  Procedures  . Prealbumin   No orders of the defined types were placed in this encounter.     Procedures: No procedures performed   Clinical Data: No additional findings.   Subjective: Chief Complaint  Patient presents with  . Right Hip - Follow-up    Mr. Goerke is following up today for his venous stasis and pitting edema.  He was seen by vascular surgery recently and recommendations were for bilateral thigh-high compression socks.  He has been wearing them more regularly.   Review of Systems   Objective: Vital Signs: Ht 6\' 1"  (1.854 m)   Wt 254 lb (115.2 kg)   BMI 33.51 kg/m   Physical Exam  Ortho Exam Bilateral lower extremities show significant improvement in venous stasis and pitting edema the.  The left lower leg is unremarkable.  The right one still has some pitting edema and venous stasis skin changes.  There are no signs of infection or cellulitis. Specialty Comments:  No specialty comments available.  Imaging: No results found.   PMFS History: Patient Active Problem List   Diagnosis Date Noted  . Avascular necrosis of bone of right hip  (HCC) 07/31/2020  . Primary localized osteoarthritis of right hip 09/01/2017  . Post-traumatic arthritis of right ankle 02/07/2017  . Osteonecrosis of left hip (HCC) 06/10/2014   Past Medical History:  Diagnosis Date  . Arthritis   . Hx MRSA infection 2004   in the  foot    History reviewed. No pertinent family history.  Past Surgical History:  Procedure Laterality Date  . FOOT SURGERY    . FOOT SURGERY Right    has a steele rod  . HAND FUSION Right 1988  . TOTAL HIP ARTHROPLASTY Left 06/10/2014   Procedure: LEFT TOTAL HIP ARTHROPLASTY ANTERIOR APPROACH;  Surgeon: 13/09/2013, MD;  Location: [facility] OR;  Service: Orthopedics;  Laterality: Left;  . WRIST SURGERY     Social History   Occupational History  . Not on file  Tobacco Use  . Smoking status: Former Smoker    Years: 20.00    Types: E-cigarettes    Quit date: 07/09/2020    Years since quitting: 0.0  . Smokeless tobacco: Never Used  Substance and Sexual Activity  . Alcohol use: No  . Drug use: No  . Sexual activity: Not on file

## 2020-08-01 LAB — EXTRA LAV TOP TUBE

## 2020-08-01 LAB — PREALBUMIN: Prealbumin: 22 mg/dL (ref 21–43)

## 2020-08-13 ENCOUNTER — Other Ambulatory Visit: Payer: Self-pay

## 2020-08-26 NOTE — Progress Notes (Signed)
DUE TO COVID-19 ONLY ONE VISITOR IS ALLOWED TO COME WITH YOU AND STAY IN THE WAITING ROOM ONLY DURING PRE OP AND PROCEDURE DAY OF SURGERY. THE 1 VISITOR  MAY VISIT WITH YOU AFTER SURGERY IN YOUR PRIVATE ROOM DURING VISITING HOURS ONLY!  YOU NEED TO HAVE A COVID 19 TEST ON_1/20/2022 ______ @_______ , THIS TEST MUST BE DONE BEFORE SURGERY,  COVID TESTING SITE [address] [place] , IT IS ON THE RIGHT GOING OUT [address] APPROXIMATELY  2 MINUTES PAST [employer] ON THE RIGHT. ONCE YOUR COVID TEST IS COMPLETED,  PLEASE BEGIN THE QUARANTINE INSTRUCTIONS AS OUTLINED IN YOUR HANDOUT.                Nathaniel [name]  08/26/2020   Your procedure is scheduled on: 09/01/2020    Report to [facility] Main  Entrance   Report to admitting at     0515 AM     Call this number if you have problems the morning of surgery [phone]    REMEMBER: NO  SOLID FOOD CANDY OR GUM AFTER MIDNIGHT. CLEAR LIQUIDS UNTIL         . NOTHING BY MOUTH EXCEPT CLEAR LIQUIDS UNTIL     0415am    . PLEASE FINISH ENSURE DRINK PER SURGEON ORDER  WHICH NEEDS TO BE COMPLETED AT 0415am    .      CLEAR LIQUID DIET   Foods Allowed                                                                    Coffee and tea, regular and decaf                            Fruit ices (not with fruit pulp)                                      Iced Popsicles                                    Carbonated beverages, regular and diet                                    Cranberry, grape and apple juices Sports drinks like Gatorade Lightly seasoned clear broth or consume(fat free) Sugar, honey syrup ___________________________________________________________________      BRUSH YOUR TEETH MORNING OF SURGERY AND RINSE YOUR MOUTH OUT, NO CHEWING GUM CANDY OR MINTS.     Take these medicines the morning of surgery with A SIP OF WATER: Amlodipine, Inhalers as usual and bring   DO NOT TAKE ANY DIABETIC MEDICATIONS  DAY OF YOUR SURGERY                               You may not have any metal on your body including hair pins and  piercings  Do not wear jewelry, make-up, lotions, powders or perfumes, deodorant             Do not wear nail polish on your fingernails.  Do not shave  48 hours prior to surgery.              Men may shave face and neck.   Do not bring valuables to the hospital. [facility].  Contacts, dentures or bridgework may not be worn into surgery.  Leave suitcase in the car. After surgery it may be brought to your room.     Patients discharged the day of surgery will not be allowed to drive home. IF YOU ARE HAVING SURGERY AND GOING HOME THE SAME DAY, YOU MUST HAVE AN ADULT TO DRIVE YOU HOME AND BE WITH YOU FOR 24 HOURS. YOU MAY GO HOME BY TAXI OR UBER OR ORTHERWISE, BUT AN ADULT MUST ACCOMPANY YOU HOME AND STAY WITH YOU FOR 24 HOURS.  Name and phone number of your driver:  Special Instructions: N/A              Please read over the following fact sheets you were given: _____________________________________________________________________  [employer] - Preparing for Surgery Before surgery, you can play an important role.  Because skin is not sterile, your skin needs to be as free of germs as possible.  You can reduce the number of germs on your skin by washing with CHG (chlorahexidine gluconate) soap before surgery.  CHG is an antiseptic cleaner which kills germs and bonds with the skin to continue killing germs even after washing. Please DO NOT use if you have an allergy to CHG or antibacterial soaps.  If your skin becomes reddened/irritated stop using the CHG and inform your nurse when you arrive at Short Stay. Do not shave (including legs and underarms) for at least 48 hours prior to the first CHG shower.  You may shave your face/neck. Please follow these instructions carefully:  1.  Shower with CHG Soap the night before  surgery and the  morning of Surgery.  2.  If you choose to wash your hair, wash your hair first as usual with your  normal  shampoo.  3.  After you shampoo, rinse your hair and body thoroughly to remove the  shampoo.                           4.  Use CHG as you would any other liquid soap.  You can apply chg directly  to the skin and wash                       Gently with a scrungie or clean washcloth.  5.  Apply the CHG Soap to your body ONLY FROM THE NECK DOWN.   Do not use on face/ open                           Wound or open sores. Avoid contact with eyes, ears mouth and genitals (private parts).                       Wash face,  Genitals (private parts) with your normal soap.             6.  Wash thoroughly, paying special attention to the area where your surgery  will be performed.  7.  Thoroughly rinse your body with warm water from the neck down.  8.  DO NOT shower/wash with your normal soap after using and rinsing off  the CHG Soap.                9.  Pat yourself dry with a clean towel.            10.  Wear clean pajamas.            11.  Place clean sheets on your bed the night of your first shower and do not  sleep with pets. Day of Surgery : Do not apply any lotions/deodorants the morning of surgery.  Please wear clean clothes to the hospital/surgery center.  FAILURE TO FOLLOW THESE INSTRUCTIONS MAY RESULT IN THE CANCELLATION OF YOUR SURGERY PATIENT SIGNATURE_________________________________  NURSE SIGNATURE__________________________________  ________________________________________________________________________

## 2020-08-27 ENCOUNTER — Other Ambulatory Visit: Payer: Self-pay | Admitting: Physician Assistant

## 2020-08-27 MED ORDER — METHOCARBAMOL 500 MG PO TABS: 500.0000 mg | ORAL_TABLET | Freq: Two times a day (BID) | ORAL | 0 refills | Status: DC | PRN

## 2020-08-27 MED ORDER — ONDANSETRON HCL 4 MG PO TABS: 4.0000 mg | ORAL_TABLET | Freq: Three times a day (TID) | ORAL | 0 refills | Status: DC | PRN

## 2020-08-27 MED ORDER — ASPIRIN EC 81 MG PO TBEC: 81.0000 mg | DELAYED_RELEASE_TABLET | Freq: Two times a day (BID) | ORAL | 0 refills | Status: DC

## 2020-08-27 MED ORDER — DOCUSATE SODIUM 100 MG PO CAPS: 100.0000 mg | ORAL_CAPSULE | Freq: Every day | ORAL | 2 refills | Status: AC | PRN

## 2020-08-27 MED ORDER — OXYCODONE-ACETAMINOPHEN 5-325 MG PO TABS: 1.0000 | ORAL_TABLET | Freq: Four times a day (QID) | ORAL | 0 refills | Status: DC | PRN

## 2020-08-28 ENCOUNTER — Encounter (HOSPITAL_COMMUNITY): Payer: Self-pay

## 2020-08-28 ENCOUNTER — Other Ambulatory Visit: Payer: Self-pay

## 2020-08-28 ENCOUNTER — Ambulatory Visit (HOSPITAL_COMMUNITY)
Admission: RE | Admit: 2020-08-28 | Discharge: 2020-08-28 | Disposition: A | Discharge status: Home / Self Care | Payer: Medicare Other | Source: Ambulatory Visit | Attending: Physician Assistant | Admitting: Physician Assistant

## 2020-08-28 ENCOUNTER — Other Ambulatory Visit (HOSPITAL_COMMUNITY)
Admission: RE | Admit: 2020-08-28 | Discharge: 2020-08-28 | Disposition: A | Discharge status: Home / Self Care | Payer: Medicare Other | Source: Ambulatory Visit | Attending: Orthopaedic Surgery | Admitting: Orthopaedic Surgery

## 2020-08-28 ENCOUNTER — Encounter (HOSPITAL_COMMUNITY)
Admission: RE | Admit: 2020-08-28 | Discharge: 2020-08-28 | Disposition: A | Discharge status: Home / Self Care | Payer: Medicare Other | Source: Ambulatory Visit | Attending: Orthopaedic Surgery | Admitting: Orthopaedic Surgery

## 2020-08-28 DIAGNOSIS — M1611 Unilateral primary osteoarthritis, right hip: Secondary | ICD-10-CM | POA: Insufficient documentation

## 2020-08-28 DIAGNOSIS — Z20822 Contact with and (suspected) exposure to covid-19: Secondary | ICD-10-CM | POA: Diagnosis not present

## 2020-08-28 DIAGNOSIS — Z01818 Encounter for other preprocedural examination: Secondary | ICD-10-CM | POA: Diagnosis not present

## 2020-08-28 HISTORY — DX: Pneumonia, unspecified organism: J18.9

## 2020-08-28 HISTORY — DX: Gastro-esophageal reflux disease without esophagitis: K21.9

## 2020-08-28 HISTORY — DX: Peripheral vascular disease, unspecified: I73.9

## 2020-08-28 HISTORY — DX: Essential (primary) hypertension: I10

## 2020-08-28 LAB — CBC WITH DIFFERENTIAL/PLATELET
Abs Immature Granulocytes: 0.04 10*3/uL (ref 0.00–0.07)
Basophils Absolute: 0.1 10*3/uL (ref 0.0–0.1)
Basophils Relative: 1 %
Eosinophils Absolute: 0.1 10*3/uL (ref 0.0–0.5)
Eosinophils Relative: 2 %
HCT: 46.3 % (ref 39.0–52.0)
Hemoglobin: 16 g/dL (ref 13.0–17.0)
Immature Granulocytes: 1 %
Lymphocytes Relative: 13 %
Lymphs Abs: 0.9 10*3/uL (ref 0.7–4.0)
MCH: 33.3 pg (ref 26.0–34.0)
MCHC: 34.6 g/dL (ref 30.0–36.0)
MCV: 96.5 fL (ref 80.0–100.0)
Monocytes Absolute: 0.7 10*3/uL (ref 0.1–1.0)
Monocytes Relative: 10 %
Neutro Abs: 5.1 10*3/uL (ref 1.7–7.7)
Neutrophils Relative %: 73 %
Platelets: 282 10*3/uL (ref 150–400)
RBC: 4.8 MIL/uL (ref 4.22–5.81)
RDW: 14.9 % (ref 11.5–15.5)
WBC: 6.9 10*3/uL (ref 4.0–10.5)
nRBC: 0 % (ref 0.0–0.2)

## 2020-08-28 LAB — SURGICAL PCR SCREEN
MRSA, PCR: NEGATIVE
Staphylococcus aureus: NEGATIVE

## 2020-08-28 LAB — SARS CORONAVIRUS 2 (TAT 6-24 HRS): SARS Coronavirus 2: NEGATIVE

## 2020-08-28 LAB — COMPREHENSIVE METABOLIC PANEL
ALT: 48 U/L — ABNORMAL HIGH (ref 0–44)
AST: 57 U/L — ABNORMAL HIGH (ref 15–41)
Albumin: 4.2 g/dL (ref 3.5–5.0)
Alkaline Phosphatase: 90 U/L (ref 38–126)
Anion gap: 16 — ABNORMAL HIGH (ref 5–15)
BUN: 15 mg/dL (ref 6–20)
CO2: 23 mmol/L (ref 22–32)
Calcium: 9.9 mg/dL (ref 8.9–10.3)
Chloride: 96 mmol/L — ABNORMAL LOW (ref 98–111)
Creatinine, Ser: 0.73 mg/dL (ref 0.61–1.24)
GFR, Estimated: >60 mL/min (ref >60)
Glucose, Bld: 117 mg/dL — ABNORMAL HIGH (ref 70–99)
Potassium: 4.4 mmol/L (ref 3.5–5.1)
Sodium: 135 mmol/L (ref 135–145)
Total Bilirubin: 0.9 mg/dL (ref 0.3–1.2)
Total Protein: 7.6 g/dL (ref 6.5–8.1)

## 2020-08-28 LAB — URINALYSIS, ROUTINE W REFLEX MICROSCOPIC
Glucose, UA: NEGATIVE mg/dL
Hgb urine dipstick: NEGATIVE
Ketones, ur: 5 mg/dL — AB
Nitrite: NEGATIVE
Protein, ur: 30 mg/dL — AB
Specific Gravity, Urine: 1.027 (ref 1.005–1.030)
pH: 5 (ref 5.0–8.0)

## 2020-08-28 LAB — APTT: aPTT: 30 seconds (ref 24–36)

## 2020-08-28 LAB — PROTIME-INR
INR: 0.9 (ref 0.8–1.2)
Prothrombin Time: 12.1 seconds (ref 11.4–15.2)

## 2020-08-28 NOTE — Progress Notes (Addendum)
Anesthesia Review:  PCP: DR Marden Noble  Cardiologist : 07/16/2020 - Vascular Visit with PA  Chest x-ray : 1 v 05/03/20  2 view done 08/28/20  EKG : 02/25/2020  Echo : Stress test: Cardiac Cath :  Activity level: can do a flight of stairs without difficulty  Sleep Study/ CPAP : Fasting Blood Sugar :      / Checks Blood Sugar -- times a day:   Blood Thinner/ Instructions /Last Dose: ASA / Instructions/ Last Dose :  U/A done 08/28/20 routed to Dr Roda Shutters.

## 2020-08-31 MED ORDER — TRANEXAMIC ACID 1000 MG/10ML IV SOLN
2000.0000 mg | INTRAVENOUS | Status: DC
  Filled 2020-08-31: qty 20

## 2020-08-31 MED ORDER — BUPIVACAINE LIPOSOME 1.3 % IJ SUSP
20.0000 mL | INTRAMUSCULAR | Status: DC
  Filled 2020-08-31: qty 20

## 2020-09-01 ENCOUNTER — Ambulatory Visit (HOSPITAL_COMMUNITY): Payer: Medicare Other | Admitting: Physician Assistant

## 2020-09-01 ENCOUNTER — Ambulatory Visit (HOSPITAL_COMMUNITY): Payer: Medicare Other

## 2020-09-01 ENCOUNTER — Observation Stay (HOSPITAL_COMMUNITY)
Admission: RE | Admit: 2020-09-01 | Discharge: 2020-09-02 | Disposition: A | Discharge status: Home / Self Care | Payer: Medicare Other | Attending: Orthopaedic Surgery | Admitting: Orthopaedic Surgery

## 2020-09-01 ENCOUNTER — Ambulatory Visit (HOSPITAL_COMMUNITY): Payer: Medicare Other | Admitting: Registered Nurse

## 2020-09-01 ENCOUNTER — Other Ambulatory Visit: Payer: Self-pay

## 2020-09-01 ENCOUNTER — Encounter (HOSPITAL_COMMUNITY)
Admission: RE | Disposition: A | Discharge status: Home / Self Care | Payer: Self-pay | Source: Home / Self Care | Attending: Orthopaedic Surgery

## 2020-09-01 ENCOUNTER — Encounter (HOSPITAL_COMMUNITY): Payer: Self-pay | Admitting: Orthopaedic Surgery

## 2020-09-01 DIAGNOSIS — Z419 Encounter for procedure for purposes other than remedying health state, unspecified: Secondary | ICD-10-CM

## 2020-09-01 DIAGNOSIS — K219 Gastro-esophageal reflux disease without esophagitis: Secondary | ICD-10-CM | POA: Diagnosis not present

## 2020-09-01 DIAGNOSIS — Z96642 Presence of left artificial hip joint: Secondary | ICD-10-CM | POA: Diagnosis not present

## 2020-09-01 DIAGNOSIS — Z96641 Presence of right artificial hip joint: Secondary | ICD-10-CM | POA: Diagnosis not present

## 2020-09-01 DIAGNOSIS — Z87891 Personal history of nicotine dependence: Secondary | ICD-10-CM | POA: Diagnosis not present

## 2020-09-01 DIAGNOSIS — Z96649 Presence of unspecified artificial hip joint: Secondary | ICD-10-CM

## 2020-09-01 DIAGNOSIS — Z79899 Other long term (current) drug therapy: Secondary | ICD-10-CM | POA: Insufficient documentation

## 2020-09-01 DIAGNOSIS — Z96643 Presence of artificial hip joint, bilateral: Secondary | ICD-10-CM | POA: Diagnosis not present

## 2020-09-01 DIAGNOSIS — M1611 Unilateral primary osteoarthritis, right hip: Secondary | ICD-10-CM | POA: Diagnosis not present

## 2020-09-01 DIAGNOSIS — M87051 Idiopathic aseptic necrosis of right femur: Principal | ICD-10-CM | POA: Diagnosis present

## 2020-09-01 DIAGNOSIS — Z471 Aftercare following joint replacement surgery: Secondary | ICD-10-CM | POA: Diagnosis not present

## 2020-09-01 DIAGNOSIS — I10 Essential (primary) hypertension: Secondary | ICD-10-CM | POA: Insufficient documentation

## 2020-09-01 DIAGNOSIS — Z7982 Long term (current) use of aspirin: Secondary | ICD-10-CM | POA: Diagnosis not present

## 2020-09-01 DIAGNOSIS — M87851 Other osteonecrosis, right femur: Secondary | ICD-10-CM | POA: Diagnosis not present

## 2020-09-01 HISTORY — PX: TOTAL HIP ARTHROPLASTY: SHX124

## 2020-09-01 LAB — TYPE AND SCREEN
ABO/RH(D): O POS
Antibody Screen: NEGATIVE

## 2020-09-01 SURGERY — ARTHROPLASTY, HIP, TOTAL, ANTERIOR APPROACH
Anesthesia: Monitor Anesthesia Care | Site: Hip | Laterality: Right

## 2020-09-01 MED ORDER — OXYCODONE HCL ER 10 MG PO T12A
10.0000 mg | EXTENDED_RELEASE_TABLET | Freq: Two times a day (BID) | ORAL | Status: DC
  Administered 2020-09-01 – 2020-09-02 (×2): 10 mg via ORAL
  Filled 2020-09-01 (×2): qty 1

## 2020-09-01 MED ORDER — DEXAMETHASONE SODIUM PHOSPHATE 10 MG/ML IJ SOLN
INTRAMUSCULAR | Status: DC | PRN
  Administered 2020-09-01: 8 mg via INTRAVENOUS

## 2020-09-01 MED ORDER — SODIUM CHLORIDE 0.9 % IV SOLN: INTRAVENOUS | Status: DC

## 2020-09-01 MED ORDER — OXYCODONE HCL 5 MG PO TABS: 10.0000 mg | ORAL_TABLET | ORAL | Status: DC | PRN

## 2020-09-01 MED ORDER — LACTATED RINGERS IV BOLUS
500.0000 mL | Freq: Once | INTRAVENOUS | Status: AC
  Administered 2020-09-01: 500 mL via INTRAVENOUS

## 2020-09-01 MED ORDER — CEFAZOLIN SODIUM-DEXTROSE 2-4 GM/100ML-% IV SOLN
2.0000 g | Freq: Four times a day (QID) | INTRAVENOUS | Status: AC
  Administered 2020-09-01 (×2): 2 g via INTRAVENOUS
  Filled 2020-09-01: qty 100

## 2020-09-01 MED ORDER — ACETAMINOPHEN 325 MG PO TABS: 325.0000 mg | ORAL_TABLET | Freq: Four times a day (QID) | ORAL | Status: DC | PRN

## 2020-09-01 MED ORDER — TRANEXAMIC ACID-NACL 1000-0.7 MG/100ML-% IV SOLN
1000.0000 mg | INTRAVENOUS | Status: AC
  Administered 2020-09-01: 1000 mg via INTRAVENOUS
  Filled 2020-09-01: qty 100

## 2020-09-01 MED ORDER — SODIUM CHLORIDE (PF) 0.9 % IJ SOLN
INTRAMUSCULAR | Status: AC
  Filled 2020-09-01: qty 20

## 2020-09-01 MED ORDER — ONDANSETRON HCL 4 MG/2ML IJ SOLN: 4.0000 mg | Freq: Four times a day (QID) | INTRAMUSCULAR | Status: DC | PRN

## 2020-09-01 MED ORDER — TRANEXAMIC ACID-NACL 1000-0.7 MG/100ML-% IV SOLN: 1000.0000 mg | Freq: Once | INTRAVENOUS | Status: DC

## 2020-09-01 MED ORDER — LOSARTAN POTASSIUM 50 MG PO TABS
50.0000 mg | ORAL_TABLET | Freq: Every day | ORAL | Status: DC
  Administered 2020-09-01 – 2020-09-02 (×2): 50 mg via ORAL
  Filled 2020-09-01 (×2): qty 1

## 2020-09-01 MED ORDER — DEXAMETHASONE SODIUM PHOSPHATE 10 MG/ML IJ SOLN
INTRAMUSCULAR | Status: AC
  Filled 2020-09-01: qty 1

## 2020-09-01 MED ORDER — MIDAZOLAM HCL 2 MG/2ML IJ SOLN
INTRAMUSCULAR | Status: AC
  Filled 2020-09-01: qty 2

## 2020-09-01 MED ORDER — ALUM & MAG HYDROXIDE-SIMETH 200-200-20 MG/5ML PO SUSP: 30.0000 mL | ORAL | Status: DC | PRN

## 2020-09-01 MED ORDER — OXYCODONE HCL 5 MG PO TABS
ORAL_TABLET | ORAL | Status: AC
  Filled 2020-09-01: qty 1

## 2020-09-01 MED ORDER — OXYCODONE HCL 5 MG PO TABS
5.0000 mg | ORAL_TABLET | ORAL | Status: DC | PRN
  Administered 2020-09-01 (×2): 5 mg via ORAL
  Filled 2020-09-01: qty 1

## 2020-09-01 MED ORDER — MENTHOL 3 MG MT LOZG: 1.0000 | LOZENGE | OROMUCOSAL | Status: DC | PRN

## 2020-09-01 MED ORDER — SODIUM CHLORIDE 0.9 % IR SOLN
Status: DC | PRN
  Administered 2020-09-01: 3000 mL

## 2020-09-01 MED ORDER — HYDROMORPHONE HCL 1 MG/ML IJ SOLN: 0.5000 mg | INTRAMUSCULAR | Status: DC | PRN

## 2020-09-01 MED ORDER — FENTANYL CITRATE (PF) 100 MCG/2ML IJ SOLN
INTRAMUSCULAR | Status: AC
  Filled 2020-09-01: qty 2

## 2020-09-01 MED ORDER — POVIDONE-IODINE 10 % EX SWAB: 2.0000 "application " | Freq: Once | CUTANEOUS | Status: DC

## 2020-09-01 MED ORDER — CEFAZOLIN SODIUM-DEXTROSE 2-4 GM/100ML-% IV SOLN
INTRAVENOUS | Status: AC
  Filled 2020-09-01: qty 100

## 2020-09-01 MED ORDER — FENTANYL CITRATE (PF) 100 MCG/2ML IJ SOLN
INTRAMUSCULAR | Status: DC | PRN
  Administered 2020-09-01: 50 ug via INTRAVENOUS

## 2020-09-01 MED ORDER — BUPIVACAINE HCL (PF) 0.25 % IJ SOLN
INTRAMUSCULAR | Status: DC | PRN
  Administered 2020-09-01: 20 mL

## 2020-09-01 MED ORDER — METHOCARBAMOL 500 MG PO TABS
500.0000 mg | ORAL_TABLET | Freq: Four times a day (QID) | ORAL | Status: DC | PRN
  Administered 2020-09-01 – 2020-09-02 (×2): 500 mg via ORAL
  Filled 2020-09-01 (×2): qty 1

## 2020-09-01 MED ORDER — DEXAMETHASONE SODIUM PHOSPHATE 10 MG/ML IJ SOLN
10.0000 mg | Freq: Once | INTRAMUSCULAR | Status: AC
  Administered 2020-09-02: 10 mg via INTRAVENOUS
  Filled 2020-09-01: qty 1

## 2020-09-01 MED ORDER — DOCUSATE SODIUM 100 MG PO CAPS
100.0000 mg | ORAL_CAPSULE | Freq: Two times a day (BID) | ORAL | Status: DC
  Administered 2020-09-01 – 2020-09-02 (×2): 100 mg via ORAL
  Filled 2020-09-01 (×2): qty 1

## 2020-09-01 MED ORDER — ASPIRIN 81 MG PO CHEW
81.0000 mg | CHEWABLE_TABLET | Freq: Two times a day (BID) | ORAL | Status: DC
  Administered 2020-09-01 – 2020-09-02 (×2): 81 mg via ORAL
  Filled 2020-09-01 (×2): qty 1

## 2020-09-01 MED ORDER — BUPIVACAINE HCL (PF) 0.25 % IJ SOLN
INTRAMUSCULAR | Status: AC
  Filled 2020-09-01: qty 30

## 2020-09-01 MED ORDER — PROPOFOL 1000 MG/100ML IV EMUL
INTRAVENOUS | Status: AC
  Filled 2020-09-01: qty 100

## 2020-09-01 MED ORDER — FUROSEMIDE 20 MG PO TABS
20.0000 mg | ORAL_TABLET | Freq: Every day | ORAL | Status: DC
  Administered 2020-09-02: 20 mg via ORAL
  Filled 2020-09-01: qty 1

## 2020-09-01 MED ORDER — KETOROLAC TROMETHAMINE 15 MG/ML IJ SOLN
INTRAMUSCULAR | Status: AC
  Administered 2020-09-01: 15 mg
  Filled 2020-09-01: qty 1

## 2020-09-01 MED ORDER — POLYETHYLENE GLYCOL 3350 17 G PO PACK
17.0000 g | PACK | Freq: Every day | ORAL | Status: DC
  Administered 2020-09-01 – 2020-09-02 (×2): 17 g via ORAL
  Filled 2020-09-01 (×2): qty 1

## 2020-09-01 MED ORDER — BUPIVACAINE IN DEXTROSE 0.75-8.25 % IT SOLN
INTRATHECAL | Status: DC | PRN
  Administered 2020-09-01: 2 mL via INTRATHECAL

## 2020-09-01 MED ORDER — SODIUM CHLORIDE (PF) 0.9 % IJ SOLN
INTRAMUSCULAR | Status: DC | PRN
  Administered 2020-09-01 (×2): 10 mL

## 2020-09-01 MED ORDER — METHOCARBAMOL 500 MG IVPB - SIMPLE MED
500.0000 mg | Freq: Four times a day (QID) | INTRAVENOUS | Status: DC | PRN
  Filled 2020-09-01: qty 50

## 2020-09-01 MED ORDER — PHENYLEPHRINE 40 MCG/ML (10ML) SYRINGE FOR IV PUSH (FOR BLOOD PRESSURE SUPPORT)
PREFILLED_SYRINGE | INTRAVENOUS | Status: DC | PRN
  Administered 2020-09-01 (×2): 80 ug via INTRAVENOUS
  Administered 2020-09-01 (×2): 40 ug via INTRAVENOUS
  Administered 2020-09-01: 80 ug via INTRAVENOUS
  Administered 2020-09-01: 40 ug via INTRAVENOUS

## 2020-09-01 MED ORDER — VANCOMYCIN HCL 1000 MG IV SOLR
INTRAVENOUS | Status: AC
  Filled 2020-09-01: qty 1000

## 2020-09-01 MED ORDER — FENTANYL CITRATE (PF) 100 MCG/2ML IJ SOLN: 25.0000 ug | INTRAMUSCULAR | Status: DC | PRN

## 2020-09-01 MED ORDER — PHENOL 1.4 % MT LIQD: 1.0000 | OROMUCOSAL | Status: DC | PRN

## 2020-09-01 MED ORDER — TRANEXAMIC ACID 1000 MG/10ML IV SOLN
INTRAVENOUS | Status: DC | PRN
  Administered 2020-09-01: 2000 mg via TOPICAL

## 2020-09-01 MED ORDER — VANCOMYCIN HCL 1 G IV SOLR
INTRAVENOUS | Status: DC | PRN
  Administered 2020-09-01: 1000 mg via TOPICAL

## 2020-09-01 MED ORDER — CEFAZOLIN SODIUM-DEXTROSE 2-4 GM/100ML-% IV SOLN
2.0000 g | INTRAVENOUS | Status: AC
  Administered 2020-09-01: 2 g via INTRAVENOUS
  Filled 2020-09-01: qty 100

## 2020-09-01 MED ORDER — OXYCODONE HCL 5 MG PO TABS: 5.0000 mg | ORAL_TABLET | Freq: Once | ORAL | Status: DC | PRN

## 2020-09-01 MED ORDER — TRANEXAMIC ACID-NACL 1000-0.7 MG/100ML-% IV SOLN
INTRAVENOUS | Status: AC
  Administered 2020-09-01: 1000 mg
  Filled 2020-09-01: qty 100

## 2020-09-01 MED ORDER — ISOPROPYL ALCOHOL 70 % SOLN
Status: AC
  Filled 2020-09-01: qty 480

## 2020-09-01 MED ORDER — MAGNESIUM CITRATE PO SOLN: 1.0000 | Freq: Once | ORAL | Status: DC | PRN

## 2020-09-01 MED ORDER — BUPIVACAINE LIPOSOME 1.3 % IJ SUSP
INTRAMUSCULAR | Status: DC | PRN
  Administered 2020-09-01: 20 mL

## 2020-09-01 MED ORDER — PHENYLEPHRINE 40 MCG/ML (10ML) SYRINGE FOR IV PUSH (FOR BLOOD PRESSURE SUPPORT)
PREFILLED_SYRINGE | INTRAVENOUS | Status: AC
  Filled 2020-09-01: qty 10

## 2020-09-01 MED ORDER — ACETAMINOPHEN 500 MG PO TABS
1000.0000 mg | ORAL_TABLET | Freq: Four times a day (QID) | ORAL | Status: DC
  Administered 2020-09-01 – 2020-09-02 (×3): 1000 mg via ORAL
  Filled 2020-09-01 (×3): qty 2

## 2020-09-01 MED ORDER — PROPOFOL 500 MG/50ML IV EMUL
INTRAVENOUS | Status: DC | PRN
  Administered 2020-09-01: 40 ug/kg/min via INTRAVENOUS

## 2020-09-01 MED ORDER — LORAZEPAM 1 MG PO TABS
2.0000 mg | ORAL_TABLET | Freq: Every day | ORAL | Status: DC
  Administered 2020-09-01: 2 mg via ORAL
  Filled 2020-09-01: qty 2

## 2020-09-01 MED ORDER — PHENYLEPHRINE HCL (PRESSORS) 10 MG/ML IV SOLN
INTRAVENOUS | Status: AC
  Filled 2020-09-01: qty 2

## 2020-09-01 MED ORDER — ONDANSETRON HCL 4 MG/2ML IJ SOLN
INTRAMUSCULAR | Status: AC
  Filled 2020-09-01: qty 2

## 2020-09-01 MED ORDER — METOCLOPRAMIDE HCL 5 MG PO TABS: 5.0000 mg | ORAL_TABLET | Freq: Three times a day (TID) | ORAL | Status: DC | PRN

## 2020-09-01 MED ORDER — OXYCODONE HCL 5 MG/5ML PO SOLN
5.0000 mg | Freq: Once | ORAL | Status: DC | PRN
Start: 2020-09-01 — End: 2020-09-01

## 2020-09-01 MED ORDER — 0.9 % SODIUM CHLORIDE (POUR BTL) OPTIME
TOPICAL | Status: DC | PRN
  Administered 2020-09-01: 1000 mL

## 2020-09-01 MED ORDER — PHENYLEPHRINE HCL-NACL 20-0.9 MG/250ML-% IV SOLN
INTRAVENOUS | Status: DC | PRN
  Administered 2020-09-01: 20 ug/min via INTRAVENOUS

## 2020-09-01 MED ORDER — KETOROLAC TROMETHAMINE 15 MG/ML IJ SOLN
15.0000 mg | Freq: Four times a day (QID) | INTRAMUSCULAR | Status: DC
  Administered 2020-09-01 – 2020-09-02 (×3): 15 mg via INTRAVENOUS
  Filled 2020-09-01 (×3): qty 1

## 2020-09-01 MED ORDER — HYDROCHLOROTHIAZIDE 12.5 MG PO CAPS
12.5000 mg | ORAL_CAPSULE | Freq: Every day | ORAL | Status: DC
Start: 2020-09-02 — End: 2020-09-02
  Administered 2020-09-02: 12.5 mg via ORAL
  Filled 2020-09-01: qty 1

## 2020-09-01 MED ORDER — CEPHALEXIN 500 MG PO CAPS: 500.0000 mg | ORAL_CAPSULE | Freq: Four times a day (QID) | ORAL | 0 refills | Status: DC

## 2020-09-01 MED ORDER — ONDANSETRON HCL 4 MG/2ML IJ SOLN
INTRAMUSCULAR | Status: DC | PRN
  Administered 2020-09-01: 4 mg via INTRAVENOUS

## 2020-09-01 MED ORDER — LACTATED RINGERS IV SOLN: INTRAVENOUS | Status: DC

## 2020-09-01 MED ORDER — ONDANSETRON HCL 4 MG PO TABS: 4.0000 mg | ORAL_TABLET | Freq: Four times a day (QID) | ORAL | Status: DC | PRN

## 2020-09-01 MED ORDER — LACTATED RINGERS IV BOLUS
250.0000 mL | Freq: Once | INTRAVENOUS | Status: AC
  Administered 2020-09-01: 250 mL via INTRAVENOUS

## 2020-09-01 MED ORDER — METOCLOPRAMIDE HCL 5 MG/ML IJ SOLN: 5.0000 mg | Freq: Three times a day (TID) | INTRAMUSCULAR | Status: DC | PRN

## 2020-09-01 MED ORDER — AMLODIPINE BESYLATE 5 MG PO TABS
5.0000 mg | ORAL_TABLET | Freq: Every day | ORAL | Status: DC
  Administered 2020-09-01: 5 mg via ORAL
  Filled 2020-09-01: qty 1

## 2020-09-01 MED ORDER — DIPHENHYDRAMINE HCL 12.5 MG/5ML PO ELIX
25.0000 mg | ORAL_SOLUTION | ORAL | Status: DC | PRN
Start: 2020-09-01 — End: 2020-09-02

## 2020-09-01 MED ORDER — MIDAZOLAM HCL 5 MG/5ML IJ SOLN
INTRAMUSCULAR | Status: DC | PRN
  Administered 2020-09-01: 2 mg via INTRAVENOUS

## 2020-09-01 MED ORDER — SORBITOL 70 % SOLN
30.0000 mL | Freq: Every day | Status: DC | PRN
  Filled 2020-09-01: qty 30

## 2020-09-01 SURGICAL SUPPLY — 48 items
BAG ZIPLOCK 12X15 (MISCELLANEOUS) ×2 IMPLANT
CELLS DAT CNTRL 66122 CELL SVR (MISCELLANEOUS) ×1 IMPLANT
CLSR STERI-STRIP ANTIMIC 1/2X4 (GAUZE/BANDAGES/DRESSINGS) ×2 IMPLANT
COVER PERINEAL POST (MISCELLANEOUS) ×2 IMPLANT
COVER SURGICAL LIGHT HANDLE (MISCELLANEOUS) ×2 IMPLANT
COVER WAND RF STERILE (DRAPES) IMPLANT
CUP SECTOR GRIPTON 58MM (Orthopedic Implant) ×2 IMPLANT
DERMABOND ADVANCED (GAUZE/BANDAGES/DRESSINGS) ×1
DERMABOND ADVANCED .7 DNX12 (GAUZE/BANDAGES/DRESSINGS) ×1 IMPLANT
DRAPE IMP U-DRAPE 54X76 (DRAPES) ×4 IMPLANT
DRAPE POUCH INSTRU U-SHP 10X18 (DRAPES) ×2 IMPLANT
DRAPE STERI IOBAN 125X83 (DRAPES) ×4 IMPLANT
DRAPE U-SHAPE 47X51 STRL (DRAPES) ×4 IMPLANT
DRSG AQUACEL AG ADV 3.5X10 (GAUZE/BANDAGES/DRESSINGS) ×2 IMPLANT
DURAPREP 26ML APPLICATOR (WOUND CARE) ×4 IMPLANT
ELECT BLADE TIP CTD 4 INCH (ELECTRODE) ×2 IMPLANT
ELECT REM PT RETURN 15FT ADLT (MISCELLANEOUS) ×2 IMPLANT
GLOVE ECLIPSE 7.0 STRL STRAW (GLOVE) ×4 IMPLANT
GLOVE SURG SYN 7.5  E (GLOVE) ×10
GLOVE SURG SYN 7.5 E (GLOVE) ×5 IMPLANT
GLOVE SURG UNDER POLY LF SZ7 (GLOVE) ×2 IMPLANT
GOWN SRG XL LVL 4 BRTHBL STRL (GOWNS) ×2 IMPLANT
GOWN STRL NON-REIN XL LVL4 (GOWNS) ×4
HANDPIECE INTERPULSE COAX TIP (DISPOSABLE) ×2
HEAD CERAMIC DELTA 36 PLUS 1.5 (Hips) ×2 IMPLANT
HOOD PEEL AWAY FLYTE STAYCOOL (MISCELLANEOUS) ×6 IMPLANT
JET LAVAGE IRRISEPT WOUND (IRRIGATION / IRRIGATOR) ×2
KIT BASIN OR (CUSTOM PROCEDURE TRAY) ×2 IMPLANT
KIT TURNOVER KIT A (KITS) IMPLANT
LAVAGE JET IRRISEPT WOUND (IRRIGATION / IRRIGATOR) ×1 IMPLANT
LINER NEUTRAL 36X58 PLUS4 ×2 IMPLANT
MARKER SKIN DUAL TIP RULER LAB (MISCELLANEOUS) ×4 IMPLANT
NEEDLE SPNL 18GX3.5 QUINCKE PK (NEEDLE) ×2 IMPLANT
PACK ANTERIOR HIP CUSTOM (KITS) ×2 IMPLANT
PENCIL SMOKE EVACUATOR (MISCELLANEOUS) IMPLANT
RTRCTR WOUND ALEXIS 18CM MED (MISCELLANEOUS) ×2
SAW OSC TIP CART 19.5X105X1.3 (SAW) ×2 IMPLANT
SCREW 6.5MMX30MM (Screw) ×2 IMPLANT
SET HNDPC FAN SPRY TIP SCT (DISPOSABLE) ×1 IMPLANT
STEM FEM ACTIS HIGH SZ10 (Stem) ×2 IMPLANT
SUT ETHIBOND 2 V 37 (SUTURE) ×2 IMPLANT
SUT MNCRL AB 3-0 PS2 18 (SUTURE) IMPLANT
SUT VIC AB 0 CT1 36 (SUTURE) ×2 IMPLANT
SUT VIC AB 1 CTX 36 (SUTURE) ×2
SUT VIC AB 1 CTX36XBRD ANBCTR (SUTURE) ×1 IMPLANT
SUT VIC AB 2-0 CT1 27 (SUTURE) ×4
SUT VIC AB 2-0 CT1 TAPERPNT 27 (SUTURE) ×2 IMPLANT
TRAY FOLEY MTR SLVR 16FR STAT (SET/KITS/TRAYS/PACK) IMPLANT

## 2020-09-01 NOTE — Plan of Care (Signed)
  Problem: Education: Goal: Knowledge of General Education information will improve Description: Including pain rating scale, medication(s)/side effects and non-pharmacologic comfort measures Outcome: Progressing   Problem: Health Behavior/Discharge Planning: Goal: Ability to manage health-related needs will improve Outcome: Progressing   Problem: Activity: Goal: Risk for activity intolerance will decrease Outcome: Progressing   

## 2020-09-01 NOTE — Discharge Instructions (Signed)

## 2020-09-01 NOTE — Evaluation (Signed)
Physical Therapy Evaluation Patient Details Name: Nathaniel [name] MRN: [id number] DOB: [birth year] Today's Date: 09/01/2020   History of Present Illness  s/p DA R THA. PMH: L DA THA  Clinical Impression  Pt is s/p THA resulting in the deficits listed below (see PT Problem List).  Pt with some gait instability likely d/t residual effects of spinal. Requiring min assist to amb~ 25' at this time. Will see again for incr gait and stair training. Pt will likely be able to d/c home after another session of PT Pt will benefit from skilled PT to increase their independence and safety with mobility to allow discharge to the venue listed below.      Follow Up Recommendations Follow surgeon's recommendation for DC plan and follow-up therapies    Equipment Recommendations  None recommended by PT    Recommendations for Other Services       Precautions / Restrictions Precautions Precautions: Fall Restrictions Weight Bearing Restrictions: No Other Position/Activity Restrictions: WBAT      Mobility  Bed Mobility Overal bed mobility: Needs Assistance Bed Mobility: Supine to Sit     Supine to sit: Min guard     General bed mobility comments: for safety    Transfers Overall transfer level: Needs assistance Equipment used: Rolling walker (2 wheeled) Transfers: Sit to/from Stand Sit to Stand: Min assist         General transfer comment: assist to control descent to lower surface, min/guard to stand from stretcher, cues for hand placement  Ambulation/Gait Ambulation/Gait assistance: Min assist Gait Distance (Feet): 25 Feet Assistive device: Rolling walker (2 wheeled) Gait Pattern/deviations: Wide base of support;Step-to pattern;Decreased stance time - right     General Gait Details: gait instability, min assist for balance and safety, cues for sequence and Rw position. deficits likely d/t residual effects of spinal  Stairs            Wheelchair Mobility    Modified Rankin  (Stroke Patients Only)       Balance Overall balance assessment: Needs assistance         Standing balance support: During functional activity;Bilateral upper extremity supported Standing balance-Leahy Scale: Poor Standing balance comment: reliant on UEs                             Pertinent Vitals/Pain Pain Assessment: No/denies pain    Home Living Family/patient expects to be discharged to:: Private residence Living Arrangements: Other relatives (cousin) Available Help at Discharge: Family;Available 24 hours/day Type of Home: House Home Access: Stairs to enter   [employer] of Steps: 1 Home Layout: One level Home Equipment: Walker - 2 wheels;Cane - single point      Prior Function Level of Independence: Independent;Independent with assistive device(s)         Comments: amb with cane prior to surgery     Hand Dominance        Extremity/Trunk Assessment   Upper Extremity Assessment Upper Extremity Assessment: Overall WFL for tasks assessed    Lower Extremity Assessment Lower Extremity Assessment: RLE deficits/detail RLE Deficits / Details: ankle grossly WFL, knee and hip 2+ to 3/5. some residual effects of spinal causing tingling in feet and gluts       Communication   Communication: No difficulties  Cognition Arousal/Alertness: Awake/alert Behavior During Therapy: WFL for tasks assessed/performed Overall Cognitive Status: Within Functional Limits for tasks assessed  General Comments      Exercises  ankle pumps x10 Heels slides x 10, AAROM  RLE    Assessment/Plan    PT Assessment Patient needs continued PT services  PT Problem List Decreased strength;Decreased mobility;Decreased activity tolerance;Decreased balance;Decreased knowledge of use of DME;Pain;Decreased coordination       PT Treatment Interventions      PT Goals (Current goals can be found in the Care  Plan section)  Acute Rehab PT Goals Patient Stated Goal: home PT Goal Formulation: With patient Time For Goal Achievement: 09/08/20 Potential to Achieve Goals: Good    Frequency     Barriers to discharge        Co-evaluation               AM-PAC PT "6 Clicks" Mobility  Outcome Measure Help needed turning from your back to your side while in a flat bed without using bedrails?: A Little Help needed moving from lying on your back to sitting on the side of a flat bed without using bedrails?: A Little Help needed moving to and from a bed to a chair (including a wheelchair)?: A Little Help needed standing up from a chair using your arms (e.g., wheelchair or bedside chair)?: A Little Help needed to walk in hospital room?: A Little Help needed climbing 3-5 steps with a railing? : A Lot 6 Click Score: 17    End of Session Equipment Utilized During Treatment: Gait belt Activity Tolerance: Patient tolerated treatment well;Other (comment) (limited d/t effects of spinal) Patient left: in chair   PT Visit Diagnosis: Unsteadiness on feet (R26.81)    Time: 3568-6168 PT Time Calculation (min) (ACUTE ONLY): 21 min   Charges:   PT Evaluation $PT Eval Low Complexity: 1 Low          Ashad Fawbush, PT  Acute Rehab Dept ([facility]) [phone] Pager [phone]  09/01/2020   [facility] 09/01/2020, 12:10 PM

## 2020-09-01 NOTE — Transfer of Care (Signed)
Immediate Anesthesia Transfer of Care Note  Patient: Nathaniel [name]  Procedure(s) Performed: RIGHT TOTAL HIP ARTHROPLASTY ANTERIOR APPROACH (Right Hip)  Patient Location: PACU  Anesthesia Type:Spinal  Level of Consciousness: awake, alert , oriented and patient cooperative  Airway & Oxygen Therapy: Patient Spontanous Breathing and Patient connected to face mask oxygen  Post-op Assessment: Report given to RN, Post -op Vital signs reviewed and stable and Patient moving all extremities  Post vital signs: Reviewed and stable  Last Vitals:  Vitals Value Taken Time  BP 124/76 09/01/20 0935  Temp    Pulse 95 09/01/20 0938  Resp 18 09/01/20 0937  SpO2 94 % 09/01/20 0938  Vitals shown include unvalidated device data.  Last Pain:  Vitals:   09/01/20 0549  TempSrc:   PainSc: 8       Patients Stated Pain Goal: 4 (09/01/20 0549)  Complications: No complications documented.

## 2020-09-01 NOTE — H&P (Signed)
PREOPERATIVE H&P  Chief Complaint: right hip degenerative joint disease  HPI: Nathaniel [name] is a 60 y.o. male who presents for surgical treatment of right hip degenerative joint disease.  He denies any changes in medical history.  Past Medical History:  Diagnosis Date  . Arthritis   . GERD (gastroesophageal reflux disease)   . Hx MRSA infection 2004   in the  foot  . Hypertension   . Peripheral vascular disease (HCC)   . Pneumonia    hx of at age 79    Past Surgical History:  Procedure Laterality Date  . FOOT SURGERY    . FOOT SURGERY Right    has a steele rod  . HAND FUSION Right 1988  . TOTAL HIP ARTHROPLASTY Left 06/10/2014   Procedure: LEFT TOTAL HIP ARTHROPLASTY ANTERIOR APPROACH;  Surgeon: Cheral Almas, MD;  Location: [facility] OR;  Service: Orthopedics;  Laterality: Left;  . WRIST SURGERY     Social History   Socioeconomic History  . Marital status: Divorced    Spouse name: Not on file  . Number of children: Not on file  . Years of education: Not on file  . Highest education level: Not on file  Occupational History  . Not on file  Tobacco Use  . Smoking status: Former Smoker    Years: 20.00    Types: E-cigarettes    Quit date: 07/09/2020    Years since quitting: 0.1  . Smokeless tobacco: Never Used  Vaping Use  . Vaping Use: Never used  Substance and Sexual Activity  . Alcohol use: Yes    Comment: 2 drinks every pm   . Drug use: No  . Sexual activity: Not on file  Other Topics Concern  . Not on file  Social History Narrative  . Not on file   Social Determinants of Health   Financial Resource Strain: Not on file  Food Insecurity: Not on file  Transportation Needs: Not on file  Physical Activity: Not on file  Stress: Not on file  Social Connections: Not on file   History reviewed. No pertinent family history. Allergies  Allergen Reactions  . Sulfa Antibiotics Rash   Prior to Admission medications   Medication Sig Start Date End Date Taking?  Authorizing Provider  amLODipine (NORVASC) 5 MG tablet Take 5 mg by mouth daily with supper.   Yes [provider]  Ascorbic Acid (VITAMIN C) 1000 MG tablet Take 1,000 mg by mouth daily.   Yes [provider]  docusate sodium (COLACE) 100 MG capsule Take 1 capsule (100 mg total) by mouth daily as needed. 08/27/20 08/27/21 Yes [name], PA-C  furosemide (LASIX) 20 MG tablet Take 20 mg by mouth daily.   Yes [provider]  hydrochlorothiazide (MICROZIDE) 12.5 MG capsule Take 12.5 mg by mouth daily.   Yes [provider]  ibuprofen (ADVIL) 200 MG tablet Take 400-600 mg by mouth every 6 (six) hours as needed for fever, headache or moderate pain.   Yes [provider]  LORazepam (ATIVAN) 2 MG tablet Take 2 mg by mouth at bedtime. 02/26/20  Yes [provider]  losartan (COZAAR) 50 MG tablet Take 50 mg by mouth daily.   Yes [provider]  magnesium gluconate (MAGONATE) 500 MG tablet Take 500 mg by mouth daily.   Yes [provider]  naproxen sodium (ALEVE) 220 MG tablet Take 220 mg by mouth 2 (two) times daily as needed (pain).   Yes [provider]  Omega-3 Fatty Acids (OMEGA-3 PO) Take 3,000 mg by mouth daily.   Yes [provider]  omeprazole (PRILOSEC) 20 MG capsule Take 20 mg by mouth daily with supper.    Yes [provider]  oxyCODONE-acetaminophen (PERCOCET) 5-325 MG tablet Take 1-2 tablets by mouth every 6 (six) hours as needed. To be taken after surgery 08/27/20  Yes [name], PA-C  SYMBICORT 160-4.5 MCG/ACT inhaler Inhale 2 puffs into the lungs 2 (two) times daily as needed for wheezing or shortness of breath. 04/30/20  Yes [provider]  traMADol (ULTRAM) 50 MG tablet Take 50 mg by mouth every 6 (six) hours as needed for moderate pain.    Yes [provider]  amoxicillin-clavulanate (AUGMENTIN) 875-125 MG tablet Take 1 tablet by mouth 2 (two) times daily. 10 day  supply Patient not taking: Reported on 08/21/2020 04/09/20   [provider]  aspirin EC 81 MG tablet Take 1 tablet (81 mg total) by mouth 2 (two) times daily. To be taken after surgery 08/27/20   [name], PA-C  methocarbamol (ROBAXIN) 500 MG tablet Take 1 tablet (500 mg total) by mouth 2 (two) times daily as needed. To be taken after surgery 08/27/20   [name], PA-C  ondansetron (ZOFRAN) 4 MG tablet Take 1 tablet (4 mg total) by mouth every 8 (eight) hours as needed for nausea or vomiting. 08/27/20   [name], PA-C  sildenafil (REVATIO) 20 MG tablet Take 20 mg by mouth daily as needed (ED).    [provider]     Positive ROS: All other systems have been reviewed and were otherwise negative with the exception of those mentioned in the HPI and as above.  Physical Exam: General: Alert, no acute distress Cardiovascular: No pedal edema Respiratory: No cyanosis, no use of accessory musculature GI: abdomen soft Skin: No lesions in the area of chief complaint Neurologic: Sensation intact distally Psychiatric: Patient is competent for consent with normal mood and affect Lymphatic: no lymphedema  MUSCULOSKELETAL: exam stable  Assessment: right hip degenerative joint disease  Plan: Plan for Procedure(s): RIGHT TOTAL HIP ARTHROPLASTY ANTERIOR APPROACH  The risks benefits and alternatives were discussed with the patient including but not limited to the risks of nonoperative treatment, versus surgical intervention including infection, bleeding, nerve injury,  blood clots, cardiopulmonary complications, morbidity, mortality, among others, and they were willing to proceed.   Preoperative templating of the joint replacement has been completed, documented, and submitted to the Operating Room personnel in order to optimize intra-operative equipment management.   Glee Arvin, MD 09/01/2020 6:14 AM

## 2020-09-01 NOTE — Anesthesia Procedure Notes (Signed)
Spinal  Patient location during procedure: OR Start time: 09/01/2020 7:20 AM End time: 09/01/2020 7:24 AM Staffing Performed: anesthesiologist  Anesthesiologist: Achille Rich, MD Preanesthetic Checklist Completed: patient identified, IV checked, risks and benefits discussed, surgical consent, monitors and equipment checked, pre-op evaluation and timeout performed Spinal Block Patient position: sitting Prep: DuraPrep Patient monitoring: cardiac monitor, continuous pulse ox and blood pressure Approach: midline Location: L3-4 Injection technique: single-shot Needle Needle type: Pencan  Needle gauge: 24 G Needle length: 9 cm Assessment Sensory level: T10 Additional Notes Functioning IV was confirmed and monitors were applied. Sterile prep and drape, including hand hygiene and sterile gloves were used. The patient was positioned and the spine was prepped. The skin was anesthetized with lidocaine.  Free flow of clear CSF was obtained prior to injecting local anesthetic into the CSF.  The spinal needle aspirated freely following injection.  The needle was carefully withdrawn.  The patient tolerated the procedure well.

## 2020-09-01 NOTE — Anesthesia Preprocedure Evaluation (Signed)
Anesthesia Evaluation  Patient identified by MRN, date of birth, ID band Patient awake    Reviewed: Allergy & Precautions, H&P , NPO status , Patient's Chart, lab work & pertinent test results  Airway Mallampati: II   Neck ROM: full    Dental   Pulmonary former smoker,    breath sounds clear to auscultation       Cardiovascular hypertension, + Peripheral Vascular Disease   Rhythm:regular Rate:Normal     Neuro/Psych    GI/Hepatic GERD  ,  Endo/Other    Renal/GU      Musculoskeletal  (+) Arthritis ,   Abdominal   Peds  Hematology   Anesthesia Other Findings   Reproductive/Obstetrics                             Anesthesia Physical Anesthesia Plan  ASA: II  Anesthesia Plan: Spinal and MAC   Post-op Pain Management:    Induction: Intravenous  PONV Risk Score and Plan: 1 and Ondansetron, Propofol infusion, Midazolam and Treatment may vary due to age or medical condition  Airway Management Planned: Simple Face Mask  Additional Equipment:   Intra-op Plan:   Post-operative Plan:   Informed Consent: I have reviewed the patients History and Physical, chart, labs and discussed the procedure including the risks, benefits and alternatives for the proposed anesthesia with the patient or authorized representative who has indicated his/her understanding and acceptance.     Dental advisory given  Plan Discussed with: CRNA, Anesthesiologist and Surgeon  Anesthesia Plan Comments:         Anesthesia Quick Evaluation
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
## 2020-09-01 NOTE — Anesthesia Postprocedure Evaluation (Signed)
Anesthesia Post Note  Patient: Nathaniel [name]  Procedure(s) Performed: RIGHT TOTAL HIP ARTHROPLASTY ANTERIOR APPROACH (Right Hip)     Patient location during evaluation: PACU Anesthesia Type: MAC and Spinal Level of consciousness: oriented and awake and alert Pain management: pain level controlled Vital Signs Assessment: post-procedure vital signs reviewed and stable Respiratory status: spontaneous breathing, respiratory function stable and patient connected to nasal cannula oxygen Cardiovascular status: blood pressure returned to baseline and stable Postop Assessment: no headache, no backache and no apparent nausea or vomiting Anesthetic complications: no   No complications documented.  Last Vitals:  Vitals:   09/01/20 1200 09/01/20 1300  BP: (!) 138/97 129/83  Pulse: 93 92  Resp: 12 16  Temp:    SpO2: 96% 94%    Last Pain:  Vitals:   09/01/20 1300  TempSrc:   PainSc: [place]

## 2020-09-01 NOTE — Op Note (Addendum)
RIGHT TOTAL HIP ARTHROPLASTY ANTERIOR APPROACH  Procedure Note [facility]   [id number]  Pre-op Diagnosis: right hip degenerative joint disease, avascular necrosis     Post-op Diagnosis: same   Operative Procedures  1. Total hip replacement; Right hip; uncemented [id number]   Surgeon: Gershon Mussel, M.D.  Assist: Oneal Grout, PA-C   Anesthesia: spinal, exparel local  Prosthesis: Depuy Acetabulum: Pinnacle 58 mm Femur: Actis 10 HO Head: 36 mm size: +1.5 Liner: +4 Bearing Type: ceramic on poly  Total Hip Arthroplasty (Anterior Approach) Op Note:  After informed consent was obtained and the operative extremity marked in the holding area, the patient was brought back to the operating room and placed supine on the HANA table. Next, the operative extremity was prepped and draped in normal sterile fashion. Surgical timeout occurred verifying patient identification, surgical site, surgical procedure and administration of antibiotics.  A modified anterior Smith-Peterson approach to the hip was performed, using the interval between tensor fascia lata and sartorius.  Dissection was carried bluntly down onto the anterior hip capsule. The lateral femoral circumflex vessels were identified and coagulated. A capsulotomy was performed and the capsular flaps tagged for later repair.  The neck osteotomy was performed. The femoral head was removed which was misshapen, severely degenerative and had inflamed pannus, the acetabular rim was cleared of soft tissue and attention was turned to reaming the acetabulum.  There was a large chondral loose body in the acetabulum that was removed.   Sequential reaming was performed under fluoroscopic guidance. We reamed to a size 57 mm, and then impacted the acetabular shell. A 30 mm cancellous screw was placed through the shell for added fixation.  The liner was then placed after irrigation and attention turned to the femur.  After placing the femoral hook, the  leg was taken to externally rotated, extended and adducted position taking care to perform soft tissue releases to allow for adequate mobilization of the femur. Soft tissue was cleared from the shoulder of the greater trochanter and the hook elevator used to improve exposure of the proximal femur. Sequential broaching performed up to a size 10. Trial neck and head were placed. I felt that the high offset neck matched his native anatomy the best.  The leg was brought back up to neutral and the construct reduced.  Antibiotic irrigation was placed in the surgical wound and kept for at least 1 minute.  The position and sizing of components, offset and leg lengths were checked using fluoroscopy. Stability of the construct was checked in extension and external rotation without any subluxation or impingement of prosthesis. We dislocated the prosthesis, dropped the leg back into position, removed trial components, and irrigated copiously. The final stem and head was then placed, the leg brought back up, the system reduced and fluoroscopy used to verify positioning.  We irrigated, obtained hemostasis and closed the capsule using #2 ethibond suture.  One gram of vancomycin powder was placed in the surgical bed. A dilute solution of 20 cc of normal saline, 1.3% exparel, 0.25% bupivacaine was injected in the soft tissues.  One gram of topical tranexamic acid was injected into the joint.  The fascia was closed with #1 vicryl plus, the deep fat layer was closed with 0 vicryl, the subcutaneous layers closed with 2.0 Vicryl Plus and the skin closed with 2.0 nylon and dermabond. A sterile dressing was applied. The patient was awakened in the operating room and taken to recovery in stable condition.  All sponge, needle,  and instrument counts were correct at the end of the case.   Tessa Lerner, my PA, was a medical necessity for opening, closing, limb positioning, retracting, exposing, and overall facilitation and timely  completion of the surgery.  Position: supine  Complications: see description of procedure.  Time Out: performed   Drains/Packing: none  Estimated blood loss: see anesthesia record  Returned to Recovery Room: in good condition.   Antibiotics: yes   Mechanical VTE (DVT) Prophylaxis: sequential compression devices, TED thigh-high  Chemical VTE (DVT) Prophylaxis: aspirin   Fluid Replacement: see anesthesia record  Specimens Removed: 1 to pathology   Sponge and Instrument Count Correct? yes   PACU: portable radiograph - low AP   Plan/RTC: Return in 2 weeks for staple removal. Weight Bearing/Load Lower Extremity: full  Hip precautions: none Suture Removal: 2 weeks   N. Glee Arvin, MD [facility] 9:07 AM   Implant Name Type Inv. Item Serial No. Manufacturer Lot No. LRB No. Used Action  SCREW 6.5MMX30MM - [id number] Screw SCREW 6.5MMX30MM  [employer] [id number] Right 1 Implanted  LINER NEUTRAL 36X58 PLUS4 - [id number]  LINER NEUTRAL 36X58 PLUS4  DEPU[employer]TH[employer] [id number] Right 1 Implanted  CUP SECTOR GRIPTON - [id number] Orthopedic Implant CUP SECTOR GRIPTON  DEPU[employer]TH[employer] [id number] Right 1 Implanted  STEM FEM ACTIS HIGH SZ10 - [id number] Stem STEM FEM ACTIS HIGH SZ10  DEPUY ORTH[employer] [id number] Right 1 Implanted  HEAD CERAMIC DELTA 36 PLUS 1.5 - [id number] Hips HEAD CERAMIC DELTA 36 PLUS 1.5  [employer]TH[employer] [id number] Right 1 Implanted

## 2020-09-02 ENCOUNTER — Encounter (HOSPITAL_COMMUNITY): Payer: Self-pay | Admitting: Orthopaedic Surgery

## 2020-09-02 DIAGNOSIS — Z96642 Presence of left artificial hip joint: Secondary | ICD-10-CM | POA: Diagnosis not present

## 2020-09-02 DIAGNOSIS — Z7982 Long term (current) use of aspirin: Secondary | ICD-10-CM | POA: Diagnosis not present

## 2020-09-02 DIAGNOSIS — Z87891 Personal history of nicotine dependence: Secondary | ICD-10-CM | POA: Diagnosis not present

## 2020-09-02 DIAGNOSIS — Z79899 Other long term (current) drug therapy: Secondary | ICD-10-CM | POA: Diagnosis not present

## 2020-09-02 DIAGNOSIS — M87051 Idiopathic aseptic necrosis of right femur: Secondary | ICD-10-CM | POA: Diagnosis not present

## 2020-09-02 DIAGNOSIS — I10 Essential (primary) hypertension: Secondary | ICD-10-CM | POA: Diagnosis not present

## 2020-09-02 LAB — BASIC METABOLIC PANEL
Anion gap: 11 (ref 5–15)
BUN: 16 mg/dL (ref 6–20)
CO2: 27 mmol/L (ref 22–32)
Calcium: 9.1 mg/dL (ref 8.9–10.3)
Chloride: 92 mmol/L — ABNORMAL LOW (ref 98–111)
Creatinine, Ser: 0.91 mg/dL (ref 0.61–1.24)
GFR, Estimated: >60 mL/min (ref >60)
Glucose, Bld: 151 mg/dL — ABNORMAL HIGH (ref 70–99)
Potassium: 4.4 mmol/L (ref 3.5–5.1)
Sodium: 130 mmol/L — ABNORMAL LOW (ref 135–145)

## 2020-09-02 LAB — CBC
HCT: 36.9 % — ABNORMAL LOW (ref 39.0–52.0)
Hemoglobin: 13 g/dL (ref 13.0–17.0)
MCH: 33.7 pg (ref 26.0–34.0)
MCHC: 35.2 g/dL (ref 30.0–36.0)
MCV: 95.6 fL (ref 80.0–100.0)
Platelets: 187 10*3/uL (ref 150–400)
RBC: 3.86 MIL/uL — ABNORMAL LOW (ref 4.22–5.81)
RDW: 14.3 % (ref 11.5–15.5)
WBC: 6.6 10*3/uL (ref 4.0–10.5)
nRBC: 0 % (ref 0.0–0.2)

## 2020-09-02 NOTE — TOC Transition Note (Signed)
Transition of Care [facility]) - CM/SW Discharge Note   Patient Details  Name: Nathaniel [name] MRN: [id number] Date of Birth: 05/26/1961  [facility]) CM/SW Contact:  Clearance Coots, LCSW Phone Number: 09/02/2020, 11:18 AM   Clinical Narrative:    Patient prearranged with [facility] HHPT.  Patient confirm DME  Final next level of care: Home w Home Health Services Barriers to Discharge: No Barriers Identified   Patient Goals and CMS Choice        Discharge Placement                       Discharge Plan and Services                          HH Arranged: PT HH Agency: [facility] (formerly [facility])        Social Determinants of Health (SDOH) Interventions     Readmission Risk Interventions No flowsheet data found.

## 2020-09-02 NOTE — Discharge Summary (Addendum)
Patient ID: Nathaniel [name] MRN: [id number] DOB/AGE: [birth year] 60 y.o.  Admit date: 09/01/2020 Discharge date: 09/02/2020  Admission Diagnoses:  Principal Problem:   Avascular necrosis of bone of right hip [facility]) Active Problems:   Status post total replacement of right hip   Discharge Diagnoses:  Same  Past Medical History:  Diagnosis Date  . Arthritis   . GERD (gastroesophageal reflux disease)   . Hx MRSA infection 2004   in the  foot  . Hypertension   . Peripheral vascular disease (HCC)   . Pneumonia    hx of at age 33     Surgeries: Procedure(s): RIGHT TOTAL HIP ARTHROPLASTY ANTERIOR APPROACH on 09/01/2020   Consultants:   Discharged Condition: Improved  Hospital Course: [name] is an 60 y.o. male who was admitted 09/01/2020 for operative treatment ofAvascular necrosis of bone of right hip (HCC). Patient has severe unremitting pain that affects sleep, daily activities, and work/hobbies. After pre-op clearance the patient was taken to the operating room on 09/01/2020 and underwent  Procedure(s): RIGHT TOTAL HIP ARTHROPLASTY ANTERIOR APPROACH.    Patient was given perioperative antibiotics:  Anti-infectives (From admission, onward)   Start     Dose/Rate Route Frequency Ordered Stop   09/01/20 1400  ceFAZolin (ANCEF) IVPB 2g/100 mL premix        2 g 200 mL/hr over 30 Minutes Intravenous Every 6 hours 09/01/20 1215 09/01/20 2108   09/01/20 1324  ceFAZolin (ANCEF) 2-4 GM/100ML-% IVPB       Note to Pharmacy: Madalyn Rob   : cabinet override      09/01/20 1324 09/02/20 0129   09/01/20 0724  vancomycin (VANCOCIN) powder  Status:  Discontinued          As needed 09/01/20 0724 09/01/20 1530   09/01/20 0600  ceFAZolin (ANCEF) IVPB 2g/100 mL premix        2 g 200 mL/hr over 30 Minutes Intravenous On call to O.R. 09/01/20 0534 09/01/20 0759   09/01/20 0000  cephALEXin (KEFLEX) 500 MG capsule        500 mg Oral 4 times daily 09/01/20 0909         Patient was given sequential  compression devices, early ambulation, and chemoprophylaxis to prevent DVT.  Patient benefited maximally from hospital stay and there were no complications.    Recent vital signs:  Patient Vitals for the past 24 hrs:  BP Temp Temp src Pulse Resp SpO2  09/02/20 0640 122/79 98.5 F (36.9 C) Oral 75 18 96 %  09/01/20 2344 132/89 98.2 F (36.8 C) Oral 91 18 95 %  09/01/20 1944 123/82 98.1 F (36.7 C) Oral 83 18 93 %  09/01/20 1541 134/81 97.9 F (36.6 C) Oral 94 18 94 %  09/01/20 1500 (!) 135/96 -- -- 88 18 96 %  09/01/20 1300 129/83 -- -- 92 16 94 %  09/01/20 1200 (!) 138/97 -- -- 93 12 96 %  09/01/20 1100 129/83 98.5 F (36.9 C) -- 84 16 92 %  09/01/20 1045 (!) 130/97 98.5 F (36.9 C) -- 93 12 97 %  09/01/20 1041 -- -- -- -- -- 96 %  09/01/20 1035 -- -- -- 86 16 95 %  09/01/20 1030 139/90 -- -- -- -- 92 %  09/01/20 1026 -- -- -- -- -- 92 %  09/01/20 1021 -- -- -- -- -- 95 %  09/01/20 1020 -- -- -- 86 14 98 %  09/01/20 1015 (!) 134/97 -- -- 90 17 94 %  09/01/20 1005 -- -- -- 92 16 92 %  09/01/20 1001 126/88 -- -- 97 (!) 9 95 %  09/01/20 1000 126/88 -- -- 96 19 90 %  09/01/20 0952 -- -- -- 89 15 97 %  09/01/20 0950 -- -- -- 90 17 95 %  09/01/20 0945 137/82 -- -- -- -- 95 %  09/01/20 0935 124/76 98.1 F (36.7 C) -- 96 16 95 %     Recent laboratory studies:  Recent Labs    09/02/20 0341  WBC 6.6  HGB 13.0  HCT 36.9*  PLT 187  NA 130*  K 4.4  CL 92*  CO2 27  BUN 16  CREATININE 0.91  GLUCOSE 151*  CALCIUM 9.1     Discharge Medications:   Allergies as of 09/02/2020      Reactions   Sulfa Antibiotics Rash      Medication List    STOP taking these medications   ibuprofen 200 MG tablet Commonly known as: ADVIL   naproxen sodium 220 MG tablet Commonly known as: ALEVE     TAKE these medications   amLODipine 5 MG tablet Commonly known as: NORVASC Take 5 mg by mouth daily with supper.   amoxicillin-clavulanate 875-125 MG tablet Commonly known as:  AUGMENTIN Take 1 tablet by mouth 2 (two) times daily. 10 day supply   aspirin EC 81 MG tablet Take 1 tablet (81 mg total) by mouth 2 (two) times daily. To be taken after surgery   cephALEXin 500 MG capsule Commonly known as: KEFLEX Take 1 capsule (500 mg total) by mouth 4 (four) times daily.   docusate sodium 100 MG capsule Commonly known as: Colace Take 1 capsule (100 mg total) by mouth daily as needed.   furosemide 20 MG tablet Commonly known as: LASIX Take 20 mg by mouth daily.   hydrochlorothiazide 12.5 MG capsule Commonly known as: MICROZIDE Take 12.5 mg by mouth daily.   LORazepam 2 MG tablet Commonly known as: ATIVAN Take 2 mg by mouth at bedtime.   losartan 50 MG tablet Commonly known as: COZAAR Take 50 mg by mouth daily.   magnesium gluconate 500 MG tablet Commonly known as: MAGONATE Take 500 mg by mouth daily.   methocarbamol 500 MG tablet Commonly known as: Robaxin Take 1 tablet (500 mg total) by mouth 2 (two) times daily as needed. To be taken after surgery   OMEGA-3 PO Take 3,000 mg by mouth daily.   omeprazole 20 MG capsule Commonly known as: PRILOSEC Take 20 mg by mouth daily with supper.   ondansetron 4 MG tablet Commonly known as: Zofran Take 1 tablet (4 mg total) by mouth every 8 (eight) hours as needed for nausea or vomiting.   oxyCODONE-acetaminophen 5-325 MG tablet Commonly known as: Percocet Take 1-2 tablets by mouth every 6 (six) hours as needed. To be taken after surgery   sildenafil 20 MG tablet Commonly known as: REVATIO Take 20 mg by mouth daily as needed (ED).   Symbicort 160-4.5 MCG/ACT inhaler Generic drug: budesonide-formoterol Inhale 2 puffs into the lungs 2 (two) times daily as needed for wheezing or shortness of breath.   traMADol 50 MG tablet Commonly known as: ULTRAM Take 50 mg by mouth every 6 (six) hours as needed for moderate pain.   vitamin C 1000 MG tablet Take 1,000 mg by mouth daily.            Durable  Medical Equipment  (From admission, onward)  Start     Ordered   09/01/20 1549  DME Walker rolling  Once       Question:  Patient needs a walker to treat with the following condition  Answer:  History of hip replacement   09/01/20 1548   09/01/20 1549  DME 3 n 1  Once        09/01/20 1548   09/01/20 1549  DME Bedside commode  Once       Question:  Patient needs a bedside commode to treat with the following condition  Answer:  History of hip replacement   09/01/20 1548          Diagnostic Studies: DG Chest 2 View  Result Date: 08/28/2020 CLINICAL DATA:  Preop for right hip replacement. EXAM: CHEST - 2 VIEW COMPARISON:  May 03, 2020. FINDINGS: The heart size and mediastinal contours are within normal limits. Both lungs are clear. The visualized skeletal structures are unremarkable. IMPRESSION: No active cardiopulmonary disease. Electronically Signed   By: Lupita Raider M.D.   On: 08/28/2020 15:22   DG Pelvis Portable  Result Date: 09/01/2020 CLINICAL DATA:  Status post total hip replacement on the right EXAM: PORTABLE PELVIS 1-2 VIEWS COMPARISON:  Intraoperative right hip images September 01, 2020; pelvis and right hip radiographs June 24, 2020 FINDINGS: Frontal view shows total hip replacements bilaterally with prosthetic components bilaterally well-seated. No fracture or dislocation. Bony overgrowth noted along each superolateral acetabulum. IMPRESSION: Status post total hip replacements bilaterally with prosthetic components well-seated. No fracture or dislocation. Evidence of underlying arthropathy. Electronically Signed   By: Bretta Bang III M.D.   On: 09/01/2020 10:10   DG C-Arm 1-60 Min  Result Date: 09/01/2020 CLINICAL DATA:  Right anterior hip replacement. EXAM: OPERATIVE right HIP (WITH PELVIS IF PERFORMED) 4 VIEWS TECHNIQUE: Fluoroscopic spot image(s) were submitted for interpretation post-operatively. COMPARISON:  06/24/2020. FINDINGS: Initial AP views of  the right hip show marked collapse of the right femoral head with advanced secondary osteoarthritis. Subsequent images show right hip arthroplasty. Left hip arthroplasty is incidentally imaged. IMPRESSION: Intraoperative visualization for right hip arthroplasty. Electronically Signed   By: Leanna Battles M.D.   On: 09/01/2020 09:25   DG HIP OPERATIVE UNILAT W OR W/O PELVIS RIGHT  Result Date: 09/01/2020 CLINICAL DATA:  Right anterior hip replacement. EXAM: OPERATIVE right HIP (WITH PELVIS IF PERFORMED) 4 VIEWS TECHNIQUE: Fluoroscopic spot image(s) were submitted for interpretation post-operatively. COMPARISON:  06/24/2020. FINDINGS: Initial AP views of the right hip show marked collapse of the right femoral head with advanced secondary osteoarthritis. Subsequent images show right hip arthroplasty. Left hip arthroplasty is incidentally imaged. IMPRESSION: Intraoperative visualization for right hip arthroplasty. Electronically Signed   By: Leanna Battles M.D.   On: 09/01/2020 09:25    Disposition: Discharge disposition: 01-Home or Self Care          Follow-up Information    Tarry Kos, MD In 2 weeks.   Specialty: Orthopedic Surgery Why: For suture removal, For wound re-check Contact information: [address] [place] [address] [phone]                Signed: [name] 09/02/2020, 8:45 AM
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
## 2020-09-02 NOTE — Plan of Care (Signed)
Patient discharged home in stable condition, waiting on ride 

## 2020-09-02 NOTE — Progress Notes (Signed)
Subjective: 1 Day Post-Op Procedure(s) (LRB): RIGHT TOTAL HIP ARTHROPLASTY ANTERIOR APPROACH (Right) Patient reports pain as mild.    Objective: Vital signs in last 24 hours: Temp:  [97.9 F (36.6 C)-98.5 F (36.9 C)] 98.5 F (36.9 C) (01/25 0640) Pulse Rate:  [75-97] 75 (01/25 0640) Resp:  [9-19] 18 (01/25 0640) BP: (122-139)/(76-97) 122/79 (01/25 0640) SpO2:  [90 %-98 %] 96 % (01/25 0640)  Intake/Output from previous day: 01/24 0701 - 01/25 0700 In: 4375 [P.O.:458; I.V.:2717; IV Piggyback:1200] Out: 3010 [Urine:2635; Blood:375] Intake/Output this shift: Total I/O In: -  Out: 150 [Urine:150]  Recent Labs    09/02/20 0341  HGB 13.0   Recent Labs    09/02/20 0341  WBC 6.6  RBC 3.86*  HCT 36.9*  PLT 187   Recent Labs    09/02/20 0341  NA 130*  K 4.4  CL 92*  CO2 27  BUN 16  CREATININE 0.91  GLUCOSE 151*  CALCIUM 9.1   No results for input(s): LABPT, INR in the last 72 hours.  Neurologically intact Neurovascular intact Sensation intact distally Intact pulses distally Dorsiflexion/Plantar flexion intact Incision: scant drainage No cellulitis present Compartment soft   Assessment/Plan: 1 Day Post-Op Procedure(s) (LRB): RIGHT TOTAL HIP ARTHROPLASTY ANTERIOR APPROACH (Right) Advance diet Up with therapy D/C IV fluids Discharge home with home health after first PT session WBAT RLE        [name] 09/02/2020, 8:05 AM

## 2020-09-04 DIAGNOSIS — Z7982 Long term (current) use of aspirin: Secondary | ICD-10-CM | POA: Diagnosis not present

## 2020-09-04 DIAGNOSIS — Z96651 Presence of right artificial knee joint: Secondary | ICD-10-CM | POA: Diagnosis not present

## 2020-09-04 DIAGNOSIS — K219 Gastro-esophageal reflux disease without esophagitis: Secondary | ICD-10-CM | POA: Diagnosis not present

## 2020-09-04 DIAGNOSIS — Z471 Aftercare following joint replacement surgery: Secondary | ICD-10-CM | POA: Diagnosis not present

## 2020-09-04 DIAGNOSIS — Z87891 Personal history of nicotine dependence: Secondary | ICD-10-CM | POA: Diagnosis not present

## 2020-09-04 DIAGNOSIS — I10 Essential (primary) hypertension: Secondary | ICD-10-CM | POA: Diagnosis not present

## 2020-09-04 DIAGNOSIS — I739 Peripheral vascular disease, unspecified: Secondary | ICD-10-CM | POA: Diagnosis not present

## 2020-09-06 DIAGNOSIS — I10 Essential (primary) hypertension: Secondary | ICD-10-CM | POA: Diagnosis not present

## 2020-09-06 DIAGNOSIS — Z87891 Personal history of nicotine dependence: Secondary | ICD-10-CM | POA: Diagnosis not present

## 2020-09-06 DIAGNOSIS — I739 Peripheral vascular disease, unspecified: Secondary | ICD-10-CM | POA: Diagnosis not present

## 2020-09-06 DIAGNOSIS — Z96651 Presence of right artificial knee joint: Secondary | ICD-10-CM | POA: Diagnosis not present

## 2020-09-06 DIAGNOSIS — Z7982 Long term (current) use of aspirin: Secondary | ICD-10-CM | POA: Diagnosis not present

## 2020-09-06 DIAGNOSIS — Z471 Aftercare following joint replacement surgery: Secondary | ICD-10-CM | POA: Diagnosis not present

## 2020-09-06 DIAGNOSIS — K219 Gastro-esophageal reflux disease without esophagitis: Secondary | ICD-10-CM | POA: Diagnosis not present

## 2020-09-08 DIAGNOSIS — Z87891 Personal history of nicotine dependence: Secondary | ICD-10-CM | POA: Diagnosis not present

## 2020-09-08 DIAGNOSIS — Z7982 Long term (current) use of aspirin: Secondary | ICD-10-CM | POA: Diagnosis not present

## 2020-09-08 DIAGNOSIS — K219 Gastro-esophageal reflux disease without esophagitis: Secondary | ICD-10-CM | POA: Diagnosis not present

## 2020-09-08 DIAGNOSIS — Z96651 Presence of right artificial knee joint: Secondary | ICD-10-CM | POA: Diagnosis not present

## 2020-09-08 DIAGNOSIS — I10 Essential (primary) hypertension: Secondary | ICD-10-CM | POA: Diagnosis not present

## 2020-09-08 DIAGNOSIS — I739 Peripheral vascular disease, unspecified: Secondary | ICD-10-CM | POA: Diagnosis not present

## 2020-09-08 DIAGNOSIS — Z471 Aftercare following joint replacement surgery: Secondary | ICD-10-CM | POA: Diagnosis not present

## 2020-09-10 DIAGNOSIS — Z96651 Presence of right artificial knee joint: Secondary | ICD-10-CM | POA: Diagnosis not present

## 2020-09-10 DIAGNOSIS — Z471 Aftercare following joint replacement surgery: Secondary | ICD-10-CM | POA: Diagnosis not present

## 2020-09-10 DIAGNOSIS — Z87891 Personal history of nicotine dependence: Secondary | ICD-10-CM | POA: Diagnosis not present

## 2020-09-10 DIAGNOSIS — I10 Essential (primary) hypertension: Secondary | ICD-10-CM | POA: Diagnosis not present

## 2020-09-10 DIAGNOSIS — K219 Gastro-esophageal reflux disease without esophagitis: Secondary | ICD-10-CM | POA: Diagnosis not present

## 2020-09-10 DIAGNOSIS — I739 Peripheral vascular disease, unspecified: Secondary | ICD-10-CM | POA: Diagnosis not present

## 2020-09-10 DIAGNOSIS — Z7982 Long term (current) use of aspirin: Secondary | ICD-10-CM | POA: Diagnosis not present

## 2020-09-12 DIAGNOSIS — Z87891 Personal history of nicotine dependence: Secondary | ICD-10-CM | POA: Diagnosis not present

## 2020-09-12 DIAGNOSIS — K219 Gastro-esophageal reflux disease without esophagitis: Secondary | ICD-10-CM | POA: Diagnosis not present

## 2020-09-12 DIAGNOSIS — Z471 Aftercare following joint replacement surgery: Secondary | ICD-10-CM | POA: Diagnosis not present

## 2020-09-12 DIAGNOSIS — I10 Essential (primary) hypertension: Secondary | ICD-10-CM | POA: Diagnosis not present

## 2020-09-12 DIAGNOSIS — I739 Peripheral vascular disease, unspecified: Secondary | ICD-10-CM | POA: Diagnosis not present

## 2020-09-12 DIAGNOSIS — Z7982 Long term (current) use of aspirin: Secondary | ICD-10-CM | POA: Diagnosis not present

## 2020-09-12 DIAGNOSIS — Z96651 Presence of right artificial knee joint: Secondary | ICD-10-CM | POA: Diagnosis not present

## 2020-09-16 ENCOUNTER — Encounter: Payer: Self-pay | Admitting: Orthopaedic Surgery

## 2020-09-16 ENCOUNTER — Ambulatory Visit (INDEPENDENT_AMBULATORY_CARE_PROVIDER_SITE_OTHER): Payer: Medicare Other | Admitting: Physician Assistant

## 2020-09-16 ENCOUNTER — Other Ambulatory Visit: Payer: Self-pay

## 2020-09-16 VITALS — Ht 74.0 in | Wt 262.0 lb

## 2020-09-16 DIAGNOSIS — Z96641 Presence of right artificial hip joint: Secondary | ICD-10-CM

## 2020-09-16 MED ORDER — OXYCODONE-ACETAMINOPHEN 5-325 MG PO TABS
1.0000 | ORAL_TABLET | Freq: Three times a day (TID) | ORAL | 0 refills | Status: DC | PRN
Start: 2020-09-16 — End: 2022-08-19

## 2020-09-16 MED ORDER — METHOCARBAMOL 500 MG PO TABS: 500.0000 mg | ORAL_TABLET | Freq: Two times a day (BID) | ORAL | 0 refills | Status: DC | PRN

## 2020-09-16 NOTE — Progress Notes (Signed)
Post-Op Visit Note   Patient: Nathaniel [name]           Date of Birth: 02/10/1961           MRN: [id number] Visit Date: 09/16/2020 PCP: Marden Noble, MD   Assessment & Plan:  Chief Complaint:  Chief Complaint  Patient presents with  . Right Hip - Follow-up    Right total hip arthroplasty 09/01/2020   Visit Diagnoses:  1. Status post total replacement of right hip     Plan: Patient is a pleasant 60 year old gentleman who comes in today 2 weeks out right total hip replacement.  He has been doing fairly well but has been exhibiting some right knee pain.  He has been getting home health physical therapy which she recently finished.  He is ambulating with two canes.  He is complaining of swelling to the right lower extremity.  He denies any severe calf pain.  No personal or family history of DVT/PE.  No chest pain or shortness of breath.  He is currently on baby aspirin twice daily.  Examination of his right hip reveals a well-healing surgical incision with nylon sutures in place.  No evidence of infection or cellulitis.  His entire right leg is very swollen and tight.  No warmth.  He does not have calf tenderness.  Negative Homans.  He is neurovascular intact distally.  Today, sutures were removed and Steri-Strips applied.  We will go ahead and order a venous Doppler ultrasound right lower extremity to rule out DVT.  Dental prophylaxis reinforced.  Follow-up with [place] in 4 weeks time for repeat evaluation and AP pelvis x-rays.  Call with concerns or questions.  Follow-Up Instructions: Return in about 4 weeks (around 10/14/2020).   Orders:  Orders Placed This Encounter  Procedures  . VAS [place] LOWER EXTREMITY VENOUS (DVT)   Meds ordered this encounter  Medications  . oxyCODONE-acetaminophen (PERCOCET) 5-325 MG tablet    Sig: Take 1-2 tablets by mouth every 8 (eight) hours as needed.    Dispense:  40 tablet    Refill:  0  . methocarbamol (ROBAXIN) 500 MG tablet    Sig: Take 1 tablet (500 mg  total) by mouth 2 (two) times daily as needed.    Dispense:  20 tablet    Refill:  0    Imaging: No new imaging  PMFS History: Patient Active Problem List   Diagnosis Date Noted  . Status post total replacement of right hip 09/01/2020  . Avascular necrosis of bone of right hip (HCC) 07/31/2020  . Primary localized osteoarthritis of right hip 09/01/2017  . Post-traumatic arthritis of right ankle 02/07/2017   Past Medical History:  Diagnosis Date  . Arthritis   . GERD (gastroesophageal reflux disease)   . Hx MRSA infection 2004   in the  foot  . Hypertension   . Peripheral vascular disease (HCC)   . Pneumonia    hx of at age 68     History reviewed. No pertinent family history.  Past Surgical History:  Procedure Laterality Date  . FOOT SURGERY    . FOOT SURGERY Right    has a steele rod  . HAND FUSION Right 1988  . TOTAL HIP ARTHROPLASTY Left 06/10/2014   Procedure: LEFT TOTAL HIP ARTHROPLASTY ANTERIOR APPROACH;  Surgeon: Cheral Almas, MD;  Location: [facility];  Service: Orthopedics;  Laterality: Left;  . TOTAL HIP ARTHROPLASTY Right 09/01/2020   Procedure: RIGHT TOTAL HIP ARTHROPLASTY ANTERIOR APPROACH;  Surgeon: Roda Shutters,  Edwin Cap, MD;  Location: [facility];  Service: Orthopedics;  Laterality: Right;  . WRIST SURGERY     Social History   Occupational History  . Not on file  Tobacco Use  . Smoking status: Former Smoker    Years: 20.00    Types: E-cigarettes    Quit date: 07/09/2020    Years since quitting: 0.1  . Smokeless tobacco: Never Used  Vaping Use  . Vaping Use: Never used  Substance and Sexual Activity  . Alcohol use: Yes    Comment: 2 drinks every pm   . Drug use: No  . Sexual activity: Not on file

## 2020-09-17 ENCOUNTER — Ambulatory Visit (HOSPITAL_COMMUNITY)
Admission: RE | Admit: 2020-09-17 | Discharge: 2020-09-17 | Disposition: A | Discharge status: Home / Self Care | Payer: Medicare Other | Source: Ambulatory Visit | Attending: Orthopaedic Surgery | Admitting: Orthopaedic Surgery

## 2020-09-17 ENCOUNTER — Telehealth: Payer: Self-pay

## 2020-09-17 DIAGNOSIS — Z96641 Presence of right artificial hip joint: Secondary | ICD-10-CM | POA: Insufficient documentation

## 2020-09-17 NOTE — Telephone Encounter (Signed)
Nathaniel [name] with [name] to let Dr. Roda Shutters know that patient is Negative for DVT, right leg.  Please advise.  Thank you.

## 2020-09-17 NOTE — Telephone Encounter (Signed)
FYI

## 2020-09-18 NOTE — Telephone Encounter (Signed)
Ok, great. Thanks

## 2020-09-19 DIAGNOSIS — Z471 Aftercare following joint replacement surgery: Secondary | ICD-10-CM | POA: Diagnosis not present

## 2020-09-19 DIAGNOSIS — I739 Peripheral vascular disease, unspecified: Secondary | ICD-10-CM | POA: Diagnosis not present

## 2020-09-19 DIAGNOSIS — Z96651 Presence of right artificial knee joint: Secondary | ICD-10-CM | POA: Diagnosis not present

## 2020-09-19 DIAGNOSIS — I10 Essential (primary) hypertension: Secondary | ICD-10-CM | POA: Diagnosis not present

## 2020-09-19 DIAGNOSIS — K219 Gastro-esophageal reflux disease without esophagitis: Secondary | ICD-10-CM | POA: Diagnosis not present

## 2020-09-19 DIAGNOSIS — Z87891 Personal history of nicotine dependence: Secondary | ICD-10-CM | POA: Diagnosis not present

## 2020-09-19 DIAGNOSIS — Z7982 Long term (current) use of aspirin: Secondary | ICD-10-CM | POA: Diagnosis not present

## 2020-10-15 ENCOUNTER — Ambulatory Visit: Payer: Medicare Other | Admitting: Vascular Surgery

## 2020-10-16 ENCOUNTER — Ambulatory Visit (INDEPENDENT_AMBULATORY_CARE_PROVIDER_SITE_OTHER): Payer: Medicare Other | Admitting: Vascular Surgery

## 2020-10-16 ENCOUNTER — Other Ambulatory Visit: Payer: Self-pay

## 2020-10-16 ENCOUNTER — Encounter: Payer: Self-pay | Admitting: Vascular Surgery

## 2020-10-16 VITALS — BP 125/71 | HR 100 | Temp 97.7°F | Resp 20 | Ht 74.0 in | Wt 237.0 lb

## 2020-10-16 DIAGNOSIS — I83811 Varicose veins of right lower extremities with pain: Secondary | ICD-10-CM | POA: Diagnosis not present

## 2020-10-16 NOTE — Progress Notes (Signed)
Patient is a 60 year old male who returns for follow-up today.  He was last seen in our APP clinic July 16, 2020.  He has had chronic swelling in his right leg.  At that time he also had some ulcerations in the right leg.  He was given a recommendation for 20 to 30 mm knee-high compression stockings.  He has been wearing these every day.  He has had significant improvement in his symptoms.  He really states at this point that he is satisfied with his compression stockings as does not want an intervention at this point.  He has no prior history of DVT.  However, he was extremely obese many years ago and this may have been the causative factor for his leg swelling and vein damage.  He currently has no ulcerations.  Review of systems: He has no shortness of breath.  He has no chest pain.  He has multiple orthopedic issues and has had hip replacement.  Past Medical History:  Diagnosis Date  . Arthritis   . GERD (gastroesophageal reflux disease)   . Hx MRSA infection 2004   in the  foot  . Hypertension   . Peripheral vascular disease (HCC)   . Pneumonia    hx of at age 86     Past Surgical History:  Procedure Laterality Date  . FOOT SURGERY    . FOOT SURGERY Right    has a steele rod  . HAND FUSION Right 1988  . TOTAL HIP ARTHROPLASTY Left 06/10/2014   Procedure: LEFT TOTAL HIP ARTHROPLASTY ANTERIOR APPROACH;  Surgeon: Cheral Almas, MD;  Location: [facility];  Service: Orthopedics;  Laterality: Left;  . TOTAL HIP ARTHROPLASTY Right 09/01/2020   Procedure: RIGHT TOTAL HIP ARTHROPLASTY ANTERIOR APPROACH;  Surgeon: Tarry Kos, MD;  Location: [facility];  Service: Orthopedics;  Laterality: Right;  . WRIST SURGERY      Current Outpatient Medications on File Prior to Visit  Medication Sig Dispense Refill  . amLODipine (NORVASC) 5 MG tablet Take 5 mg by mouth daily with supper.    . Ascorbic Acid (VITAMIN C) 1000 MG tablet Take 1,000 mg by mouth daily.    Marland Kitchen aspirin EC 81 MG tablet Take 1 tablet  (81 mg total) by mouth 2 (two) times daily. To be taken after surgery 84 tablet 0  . cephALEXin (KEFLEX) 500 MG capsule Take 1 capsule (500 mg total) by mouth 4 (four) times daily. 40 capsule 0  . docusate sodium (COLACE) 100 MG capsule Take 1 capsule (100 mg total) by mouth daily as needed. 30 capsule 2  . furosemide (LASIX) 20 MG tablet Take 20 mg by mouth daily.    . hydrochlorothiazide (MICROZIDE) 12.5 MG capsule Take 12.5 mg by mouth daily.    Marland Kitchen LORazepam (ATIVAN) 2 MG tablet Take 2 mg by mouth at bedtime.    Marland Kitchen losartan (COZAAR) 50 MG tablet Take 50 mg by mouth daily.    . magnesium gluconate (MAGONATE) 500 MG tablet Take 500 mg by mouth daily.    . methocarbamol (ROBAXIN) 500 MG tablet Take 1 tablet (500 mg total) by mouth 2 (two) times daily as needed. 20 tablet 0  . Omega-3 Fatty Acids (OMEGA-3 PO) Take 3,000 mg by mouth daily.    Marland Kitchen omeprazole (PRILOSEC) 20 MG capsule Take 20 mg by mouth daily with supper.     . ondansetron (ZOFRAN) 4 MG tablet Take 1 tablet (4 mg total) by mouth every 8 (eight) hours as needed for  nausea or vomiting. 40 tablet 0  . oxyCODONE-acetaminophen (PERCOCET) 5-325 MG tablet Take 1-2 tablets by mouth every 8 (eight) hours as needed. 40 tablet 0  . sildenafil (REVATIO) 20 MG tablet Take 20 mg by mouth daily as needed (ED).    . SYMBICORT 160-4.5 MCG/ACT inhaler Inhale 2 puffs into the lungs 2 (two) times daily as needed for wheezing or shortness of breath.    . traMADol (ULTRAM) 50 MG tablet Take 50 mg by mouth every 6 (six) hours as needed for moderate pain.      No current facility-administered medications on file prior to visit.   Physical exam  Vitals:   10/16/20 1232  BP: 125/71  Pulse: 100  Resp: 20  Temp: 97.7 F (36.5 C)  SpO2: 97%  Weight: 237 lb (107.5 kg)  Height: 6\' 2"  (1.88 m)    Extremities: Right lower extremity edema approximately 5 to 10% larger than left leg.  Hemosiderin staining circumferentially from the knee down to the foot on  the right side.  No open ulcer or wound.  Data: I reviewed the patient's previous duplex ultrasound which showed a 6 to 7 mm greater saphenous vein on the right side with diffuse reflux.  There was also diffuse deep vein reflux in the popliteal and femoral vein.  Assessment: Right leg swelling with superficial and deep venous reflux.  Patient is currently satisfied with compression stockings alone for relief of his symptoms.  Plan: Patient will continue to wear his compression stockings and replace them intermittently as they wear out.  He he wishes further evaluation in the future we could consider him for laser ablation of his right greater saphenous vein to decrease his symptoms.  I did discuss with him today that he is not at risk of limb loss and has easily palpable pulses in his feet.  I did also discuss with him the importance of continuing his compression stockings to prevent skin breakdown in the future.  He will follow up on an as-needed basis.  , MD [facility] Office: [phone]

## 2020-12-08 DIAGNOSIS — G4730 Sleep apnea, unspecified: Secondary | ICD-10-CM | POA: Diagnosis not present

## 2020-12-08 DIAGNOSIS — E559 Vitamin D deficiency, unspecified: Secondary | ICD-10-CM | POA: Diagnosis not present

## 2020-12-08 DIAGNOSIS — L409 Psoriasis, unspecified: Secondary | ICD-10-CM | POA: Diagnosis not present

## 2020-12-08 DIAGNOSIS — L03115 Cellulitis of right lower limb: Secondary | ICD-10-CM | POA: Diagnosis not present

## 2020-12-08 DIAGNOSIS — M545 Low back pain, unspecified: Secondary | ICD-10-CM | POA: Diagnosis not present

## 2020-12-08 DIAGNOSIS — K219 Gastro-esophageal reflux disease without esophagitis: Secondary | ICD-10-CM | POA: Diagnosis not present

## 2020-12-08 DIAGNOSIS — Z0001 Encounter for general adult medical examination with abnormal findings: Secondary | ICD-10-CM | POA: Diagnosis not present

## 2020-12-08 DIAGNOSIS — Z1211 Encounter for screening for malignant neoplasm of colon: Secondary | ICD-10-CM | POA: Diagnosis not present

## 2020-12-08 DIAGNOSIS — I10 Essential (primary) hypertension: Secondary | ICD-10-CM | POA: Diagnosis not present

## 2020-12-08 DIAGNOSIS — Z79899 Other long term (current) drug therapy: Secondary | ICD-10-CM | POA: Diagnosis not present

## 2020-12-08 DIAGNOSIS — F5109 Other insomnia not due to a substance or known physiological condition: Secondary | ICD-10-CM | POA: Diagnosis not present

## 2021-03-11 DIAGNOSIS — R112 Nausea with vomiting, unspecified: Secondary | ICD-10-CM | POA: Diagnosis not present

## 2021-04-27 DIAGNOSIS — T23239A Burn of second degree of unspecified multiple fingers (nail), not including thumb, initial encounter: Secondary | ICD-10-CM | POA: Diagnosis not present

## 2021-04-27 DIAGNOSIS — S20211A Contusion of right front wall of thorax, initial encounter: Secondary | ICD-10-CM | POA: Diagnosis not present

## 2021-05-03 IMAGING — CR DG CHEST 2V
2 series · 2 of 2 positions shown · non-contrast
Comparison: May 03, 2020.

CLINICAL DATA: Preop for right hip replacement.

EXAM:
CHEST - 2 VIEW

[w chest pa]
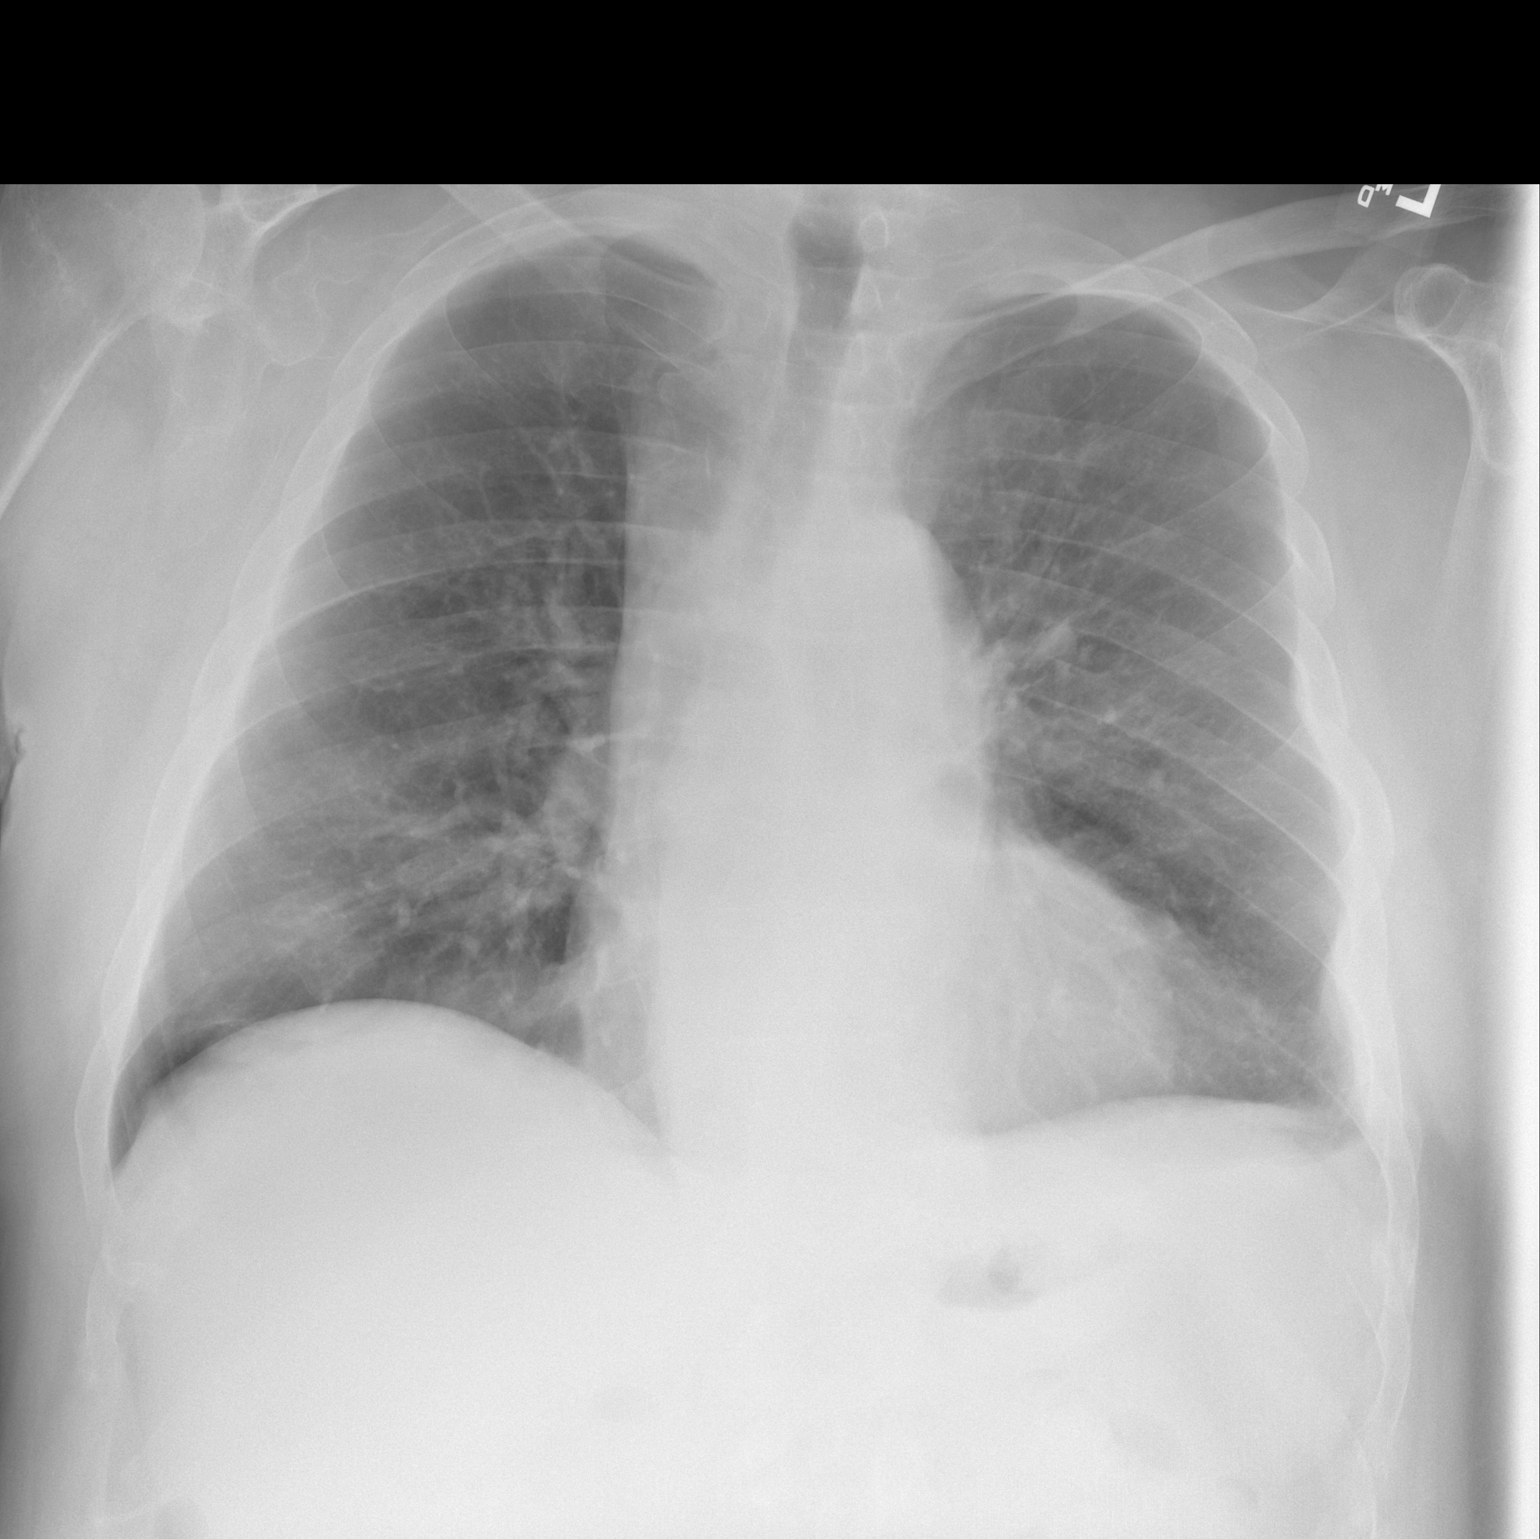

[w chest lat]
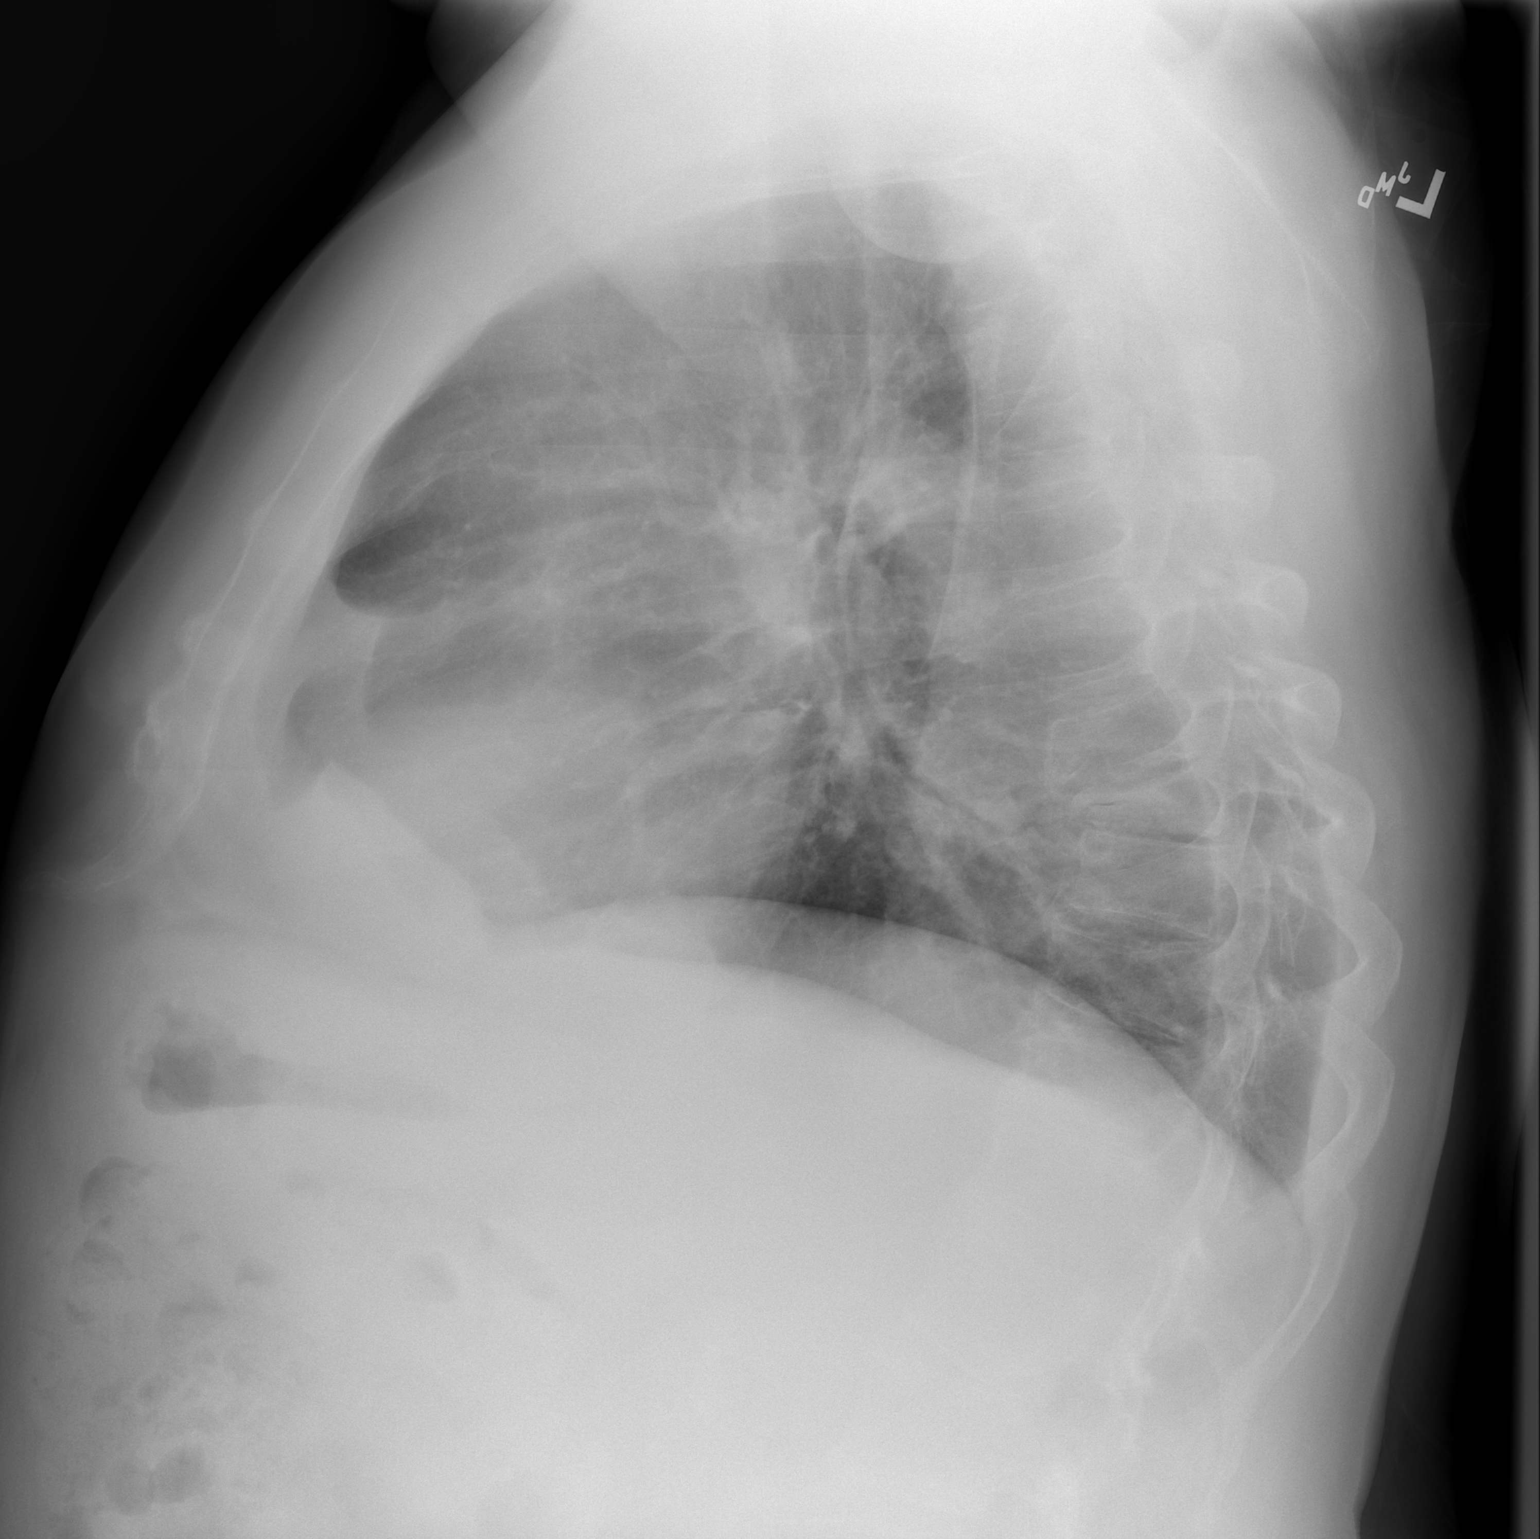

[2 of 2 positions shown; findings below may reference images not displayed]

FINDINGS: The heart size and mediastinal contours are within normal limits.
Both lungs are clear. The visualized skeletal structures are
unremarkable.
IMPRESSION: No active cardiopulmonary disease.

## 2021-05-07 IMAGING — RF DG HIP (WITH PELVIS) OPERATIVE*R*
1 series · 4 of 4 positions shown · non-contrast
Comparison: 06/24/2020.

CLINICAL DATA: Right anterior hip replacement.

EXAM:
OPERATIVE right HIP (WITH PELVIS IF PERFORMED) 4 VIEWS
TECHNIQUE: Fluoroscopic spot image(s) were submitted for interpretation
post-operatively.

[Series 1: unknown protocol · 0.20mm/px · 4 of 4 slices shown]
[im 1/4]
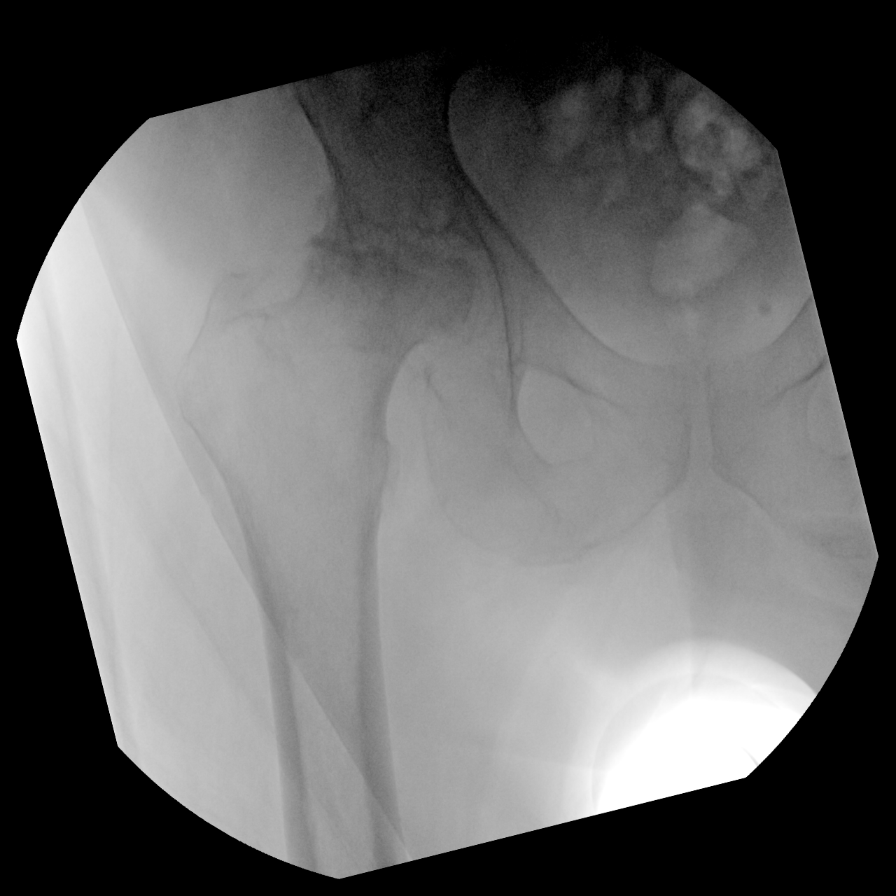
[im 2/4]
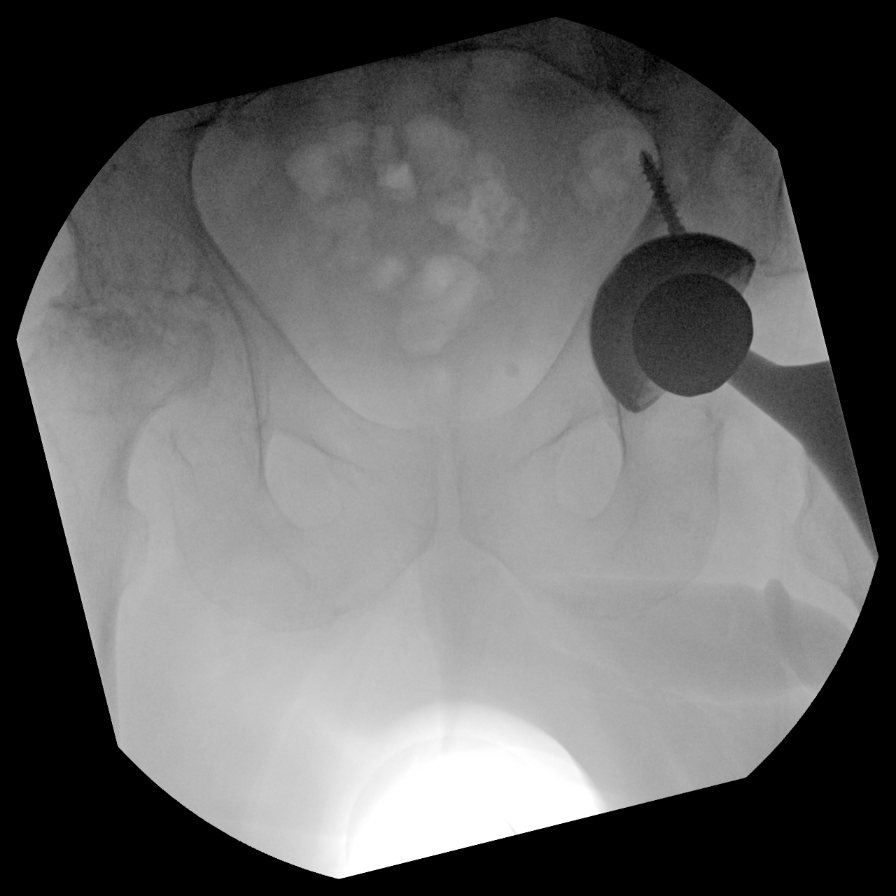
[im 3/4]
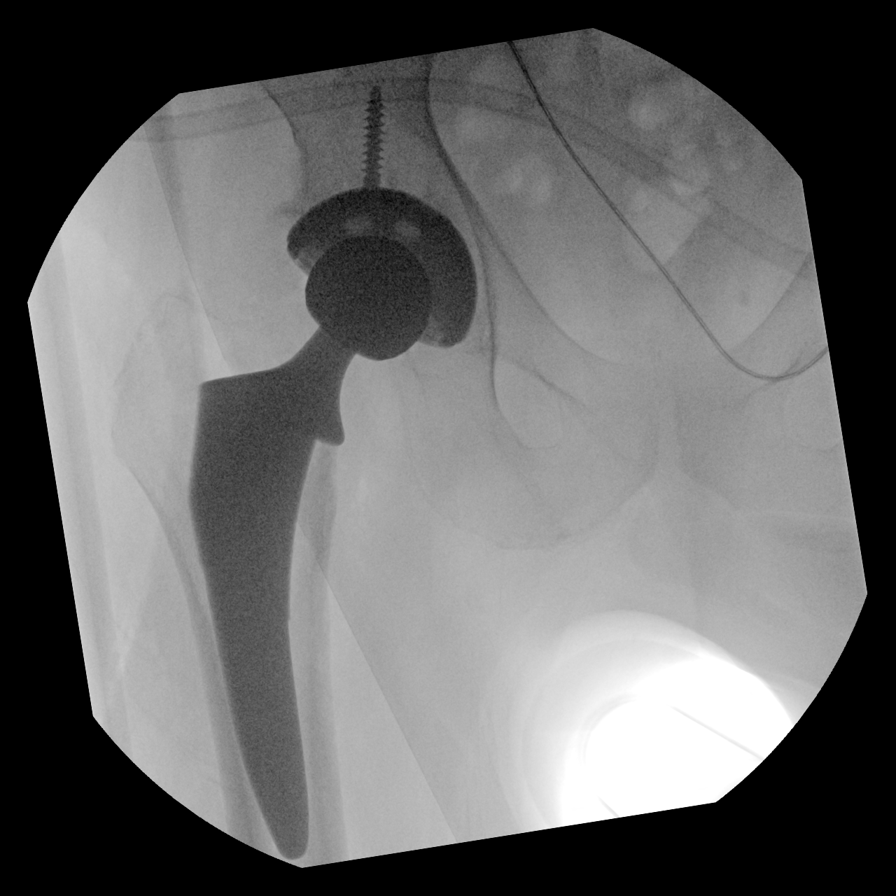
[im 4/4]
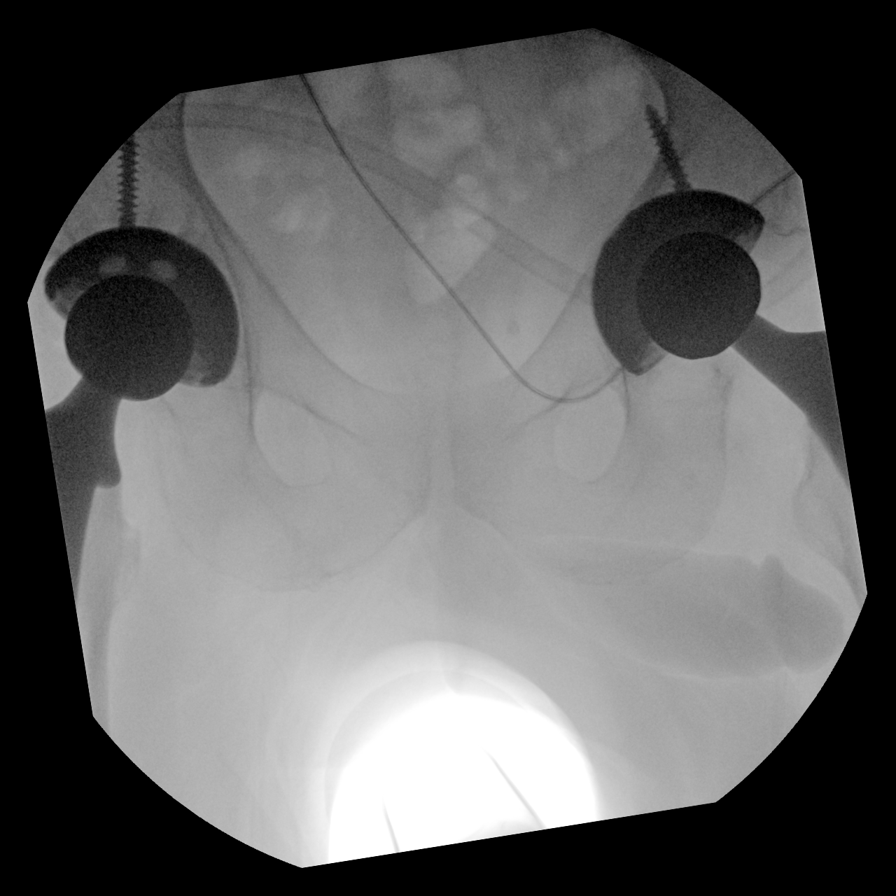

[4 of 4 positions shown; findings below may reference images not displayed]

FINDINGS: Initial AP views of the right hip show marked collapse of the right
femoral head with advanced secondary osteoarthritis. Subsequent
images show right hip arthroplasty. Left hip arthroplasty is
incidentally imaged.
IMPRESSION: Intraoperative visualization for right hip arthroplasty.

## 2021-06-11 DIAGNOSIS — I8311 Varicose veins of right lower extremity with inflammation: Secondary | ICD-10-CM | POA: Diagnosis not present

## 2021-06-11 DIAGNOSIS — G4730 Sleep apnea, unspecified: Secondary | ICD-10-CM | POA: Diagnosis not present

## 2021-06-11 DIAGNOSIS — M7581 Other shoulder lesions, right shoulder: Secondary | ICD-10-CM | POA: Diagnosis not present

## 2021-06-11 DIAGNOSIS — I10 Essential (primary) hypertension: Secondary | ICD-10-CM | POA: Diagnosis not present

## 2021-06-11 DIAGNOSIS — L409 Psoriasis, unspecified: Secondary | ICD-10-CM | POA: Diagnosis not present

## 2021-06-11 DIAGNOSIS — E559 Vitamin D deficiency, unspecified: Secondary | ICD-10-CM | POA: Diagnosis not present

## 2021-06-11 DIAGNOSIS — G629 Polyneuropathy, unspecified: Secondary | ICD-10-CM | POA: Diagnosis not present

## 2021-06-11 DIAGNOSIS — F5109 Other insomnia not due to a substance or known physiological condition: Secondary | ICD-10-CM | POA: Diagnosis not present

## 2021-06-11 DIAGNOSIS — M545 Low back pain, unspecified: Secondary | ICD-10-CM | POA: Diagnosis not present

## 2021-06-11 DIAGNOSIS — K219 Gastro-esophageal reflux disease without esophagitis: Secondary | ICD-10-CM | POA: Diagnosis not present

## 2021-09-28 DIAGNOSIS — M5451 Vertebrogenic low back pain: Secondary | ICD-10-CM | POA: Diagnosis not present

## 2021-09-28 DIAGNOSIS — G629 Polyneuropathy, unspecified: Secondary | ICD-10-CM | POA: Diagnosis not present

## 2021-09-28 DIAGNOSIS — G4730 Sleep apnea, unspecified: Secondary | ICD-10-CM | POA: Diagnosis not present

## 2021-09-28 DIAGNOSIS — I8311 Varicose veins of right lower extremity with inflammation: Secondary | ICD-10-CM | POA: Diagnosis not present

## 2021-09-28 DIAGNOSIS — I10 Essential (primary) hypertension: Secondary | ICD-10-CM | POA: Diagnosis not present

## 2021-09-28 DIAGNOSIS — L03119 Cellulitis of unspecified part of limb: Secondary | ICD-10-CM | POA: Diagnosis not present

## 2021-09-30 DIAGNOSIS — K219 Gastro-esophageal reflux disease without esophagitis: Secondary | ICD-10-CM | POA: Diagnosis not present

## 2021-09-30 DIAGNOSIS — I10 Essential (primary) hypertension: Secondary | ICD-10-CM | POA: Diagnosis not present

## 2021-11-03 DIAGNOSIS — I10 Essential (primary) hypertension: Secondary | ICD-10-CM | POA: Diagnosis not present

## 2021-11-03 DIAGNOSIS — K219 Gastro-esophageal reflux disease without esophagitis: Secondary | ICD-10-CM | POA: Diagnosis not present

## 2021-12-22 ENCOUNTER — Other Ambulatory Visit: Payer: Self-pay | Admitting: Internal Medicine

## 2021-12-22 ENCOUNTER — Ambulatory Visit
Admission: RE | Admit: 2021-12-22 | Discharge: 2021-12-22 | Disposition: A | Discharge status: Home / Self Care | Payer: Medicare Other | Source: Ambulatory Visit | Attending: Internal Medicine | Admitting: Internal Medicine

## 2021-12-22 DIAGNOSIS — L97529 Non-pressure chronic ulcer of other part of left foot with unspecified severity: Secondary | ICD-10-CM

## 2021-12-22 DIAGNOSIS — L409 Psoriasis, unspecified: Secondary | ICD-10-CM | POA: Diagnosis not present

## 2021-12-22 DIAGNOSIS — I700 Atherosclerosis of aorta: Secondary | ICD-10-CM | POA: Diagnosis not present

## 2021-12-22 DIAGNOSIS — Z79899 Other long term (current) drug therapy: Secondary | ICD-10-CM | POA: Diagnosis not present

## 2021-12-22 DIAGNOSIS — Z0000 Encounter for general adult medical examination without abnormal findings: Secondary | ICD-10-CM | POA: Diagnosis not present

## 2021-12-22 DIAGNOSIS — I10 Essential (primary) hypertension: Secondary | ICD-10-CM | POA: Diagnosis not present

## 2021-12-22 DIAGNOSIS — M545 Low back pain, unspecified: Secondary | ICD-10-CM | POA: Diagnosis not present

## 2021-12-22 DIAGNOSIS — L03115 Cellulitis of right lower limb: Secondary | ICD-10-CM | POA: Diagnosis not present

## 2021-12-22 DIAGNOSIS — M79672 Pain in left foot: Secondary | ICD-10-CM | POA: Diagnosis not present

## 2021-12-22 DIAGNOSIS — G4730 Sleep apnea, unspecified: Secondary | ICD-10-CM | POA: Diagnosis not present

## 2021-12-22 DIAGNOSIS — K219 Gastro-esophageal reflux disease without esophagitis: Secondary | ICD-10-CM | POA: Diagnosis not present

## 2021-12-22 DIAGNOSIS — E559 Vitamin D deficiency, unspecified: Secondary | ICD-10-CM | POA: Diagnosis not present

## 2022-02-04 ENCOUNTER — Ambulatory Visit (INDEPENDENT_AMBULATORY_CARE_PROVIDER_SITE_OTHER): Payer: Medicare Other

## 2022-02-04 ENCOUNTER — Other Ambulatory Visit: Payer: Self-pay | Admitting: Podiatry

## 2022-02-04 ENCOUNTER — Ambulatory Visit (INDEPENDENT_AMBULATORY_CARE_PROVIDER_SITE_OTHER): Payer: Medicare Other | Admitting: Podiatry

## 2022-02-04 DIAGNOSIS — L97522 Non-pressure chronic ulcer of other part of left foot with fat layer exposed: Secondary | ICD-10-CM

## 2022-02-04 DIAGNOSIS — I999 Unspecified disorder of circulatory system: Secondary | ICD-10-CM

## 2022-02-04 DIAGNOSIS — I10 Essential (primary) hypertension: Secondary | ICD-10-CM | POA: Diagnosis not present

## 2022-02-04 DIAGNOSIS — M79672 Pain in left foot: Secondary | ICD-10-CM

## 2022-02-04 DIAGNOSIS — K219 Gastro-esophageal reflux disease without esophagitis: Secondary | ICD-10-CM | POA: Diagnosis not present

## 2022-02-05 ENCOUNTER — Other Ambulatory Visit: Payer: Self-pay | Admitting: Podiatry

## 2022-02-05 DIAGNOSIS — I999 Unspecified disorder of circulatory system: Secondary | ICD-10-CM

## 2022-02-08 ENCOUNTER — Ambulatory Visit (HOSPITAL_COMMUNITY)
Admission: RE | Admit: 2022-02-08 | Discharge: 2022-02-08 | Disposition: A | Discharge status: Home / Self Care | Payer: 59 | Source: Ambulatory Visit | Attending: Podiatry | Admitting: Podiatry

## 2022-02-08 DIAGNOSIS — I999 Unspecified disorder of circulatory system: Secondary | ICD-10-CM | POA: Diagnosis not present

## 2022-02-08 DIAGNOSIS — M79672 Pain in left foot: Secondary | ICD-10-CM

## 2022-02-11 NOTE — Progress Notes (Signed)
Subjective:  Patient ID: Nathaniel [name], male    DOB: 1961/07/08,  MRN: [id number]  Chief Complaint  Patient presents with   Wound Check    61 y.o. male presents for wound care.  Patient presents with complaint of left submetatarsal 5 ulceration with fat layer exposed.  Patient states started about 5 months respiratory gotten worse.  He has not seen anyone else prior to seeing me.  He is not a diabetic.  Hurts with ambulation.  He does have some neuropathy.  He wanted to get it evaluated.   Review of Systems: Negative except as noted in the HPI. Denies N/V/F/Ch.  Past Medical History:  Diagnosis Date   Arthritis    GERD (gastroesophageal reflux disease)    Hx MRSA infection 2004   in the  foot   Hypertension    Peripheral vascular disease (HCC)    Pneumonia    hx of at age 61     Current Outpatient Medications:    amLODipine (NORVASC) 5 MG tablet, Take 5 mg by mouth daily with supper., Disp: , Rfl:    Ascorbic Acid (VITAMIN C) 1000 MG tablet, Take 1,000 mg by mouth daily., Disp: , Rfl:    aspirin EC 81 MG tablet, Take 1 tablet (81 mg total) by mouth 2 (two) times daily. To be taken after surgery, Disp: 84 tablet, Rfl: 0   cephALEXin (KEFLEX) 500 MG capsule, Take 1 capsule (500 mg total) by mouth 4 (four) times daily., Disp: 40 capsule, Rfl: 0   furosemide (LASIX) 20 MG tablet, Take 20 mg by mouth daily., Disp: , Rfl:    hydrochlorothiazide (MICROZIDE) 12.5 MG capsule, Take 12.5 mg by mouth daily., Disp: , Rfl:    LORazepam (ATIVAN) 2 MG tablet, Take 2 mg by mouth at bedtime., Disp: , Rfl:    losartan (COZAAR) 50 MG tablet, Take 50 mg by mouth daily., Disp: , Rfl:    magnesium gluconate (MAGONATE) 500 MG tablet, Take 500 mg by mouth daily., Disp: , Rfl:    methocarbamol (ROBAXIN) 500 MG tablet, Take 1 tablet (500 mg total) by mouth 2 (two) times daily as needed., Disp: 20 tablet, Rfl: 0   Omega-3 Fatty Acids (OMEGA-3 PO), Take 3,000 mg by mouth daily., Disp: , Rfl:    omeprazole  (PRILOSEC) 20 MG capsule, Take 20 mg by mouth daily with supper. , Disp: , Rfl:    ondansetron (ZOFRAN) 4 MG tablet, Take 1 tablet (4 mg total) by mouth every 8 (eight) hours as needed for nausea or vomiting., Disp: 40 tablet, Rfl: 0   oxyCODONE-acetaminophen (PERCOCET) 5-325 MG tablet, Take 1-2 tablets by mouth every 8 (eight) hours as needed., Disp: 40 tablet, Rfl: 0   sildenafil (REVATIO) 20 MG tablet, Take 20 mg by mouth daily as needed (ED)., Disp: , Rfl:    SYMBICORT 160-4.5 MCG/ACT inhaler, Inhale 2 puffs into the lungs 2 (two) times daily as needed for wheezing or shortness of breath., Disp: , Rfl:    traMADol (ULTRAM) 50 MG tablet, Take 50 mg by mouth every 6 (six) hours as needed for moderate pain. , Disp: , Rfl:   Social History   Tobacco Use  Smoking Status Former   Types: E-cigarettes   Quit date: 07/09/2020   Years since quitting: 1.5  Smokeless Tobacco Never    Allergies  Allergen Reactions   Sulfa Antibiotics Rash   Objective:  There were no vitals filed for this visit. There is no height or weight on file to  calculate BMI. Constitutional Well developed. Well nourished.  Vascular Dorsalis pedis pulses faintly palpable bilaterally. Posterior tibial pulses faintly palpable bilaterally. Capillary refill normal to all digits.  No cyanosis or clubbing noted. Pedal hair growth normal.  Neurologic Normal speech. Oriented to person, place, and time. Protective sensation absent  Dermatologic Wound Location: Left submetatarsal 5 with fat layer exposed.  No probing down to bone.  No purulent drainage noted no redness noted. Wound Base: Granular/Healthy Peri-wound: Calloused Exudate: Scant/small amount See below Wound Measurements: -See below  Orthopedic: No pain to palpation either foot.   Radiographs: 3 views of skeletally mature adult left foot: Previous hardware noted with subtalar joint fusion good fusion noted across the STJ joint.  Plantarflexed fifth metatarsal  noted.  Midfoot arthritis noted no other bony abnormalities identified. Assessment:   1. Chronic foot ulcer with fat layer exposed, left (HCC)   2. Vascular abnormality    Plan:  Patient was evaluated and treated and all questions answered.  Ulcer left submetatarsal 5 with fat layer exposed -Debridement as below. -Dressed with Betadine, DSD. -Continue off-loading with surgical shoe.  Vascular abnormality -Explained to patient the etiology of abnormality various treatment options were discussed.  ABIs PVRs were ordered to assess the vascular flow  Procedure: Excisional Debridement of Wound Tool: Sharp chisel blade/tissue nipper Rationale: Removal of non-viable soft tissue from the wound to promote healing.  Anesthesia: none Pre-Debridement Wound Measurements: 1 cm x 1 cm x 0.3 cm  Post-Debridement Wound Measurements: 1.1 cm x 1.2 cm x 0.3 cm  Type of Debridement: Sharp Excisional Tissue Removed: Non-viable soft tissue Blood loss: Minimal (<50cc) Depth of Debridement: subcutaneous tissue. Technique: Sharp excisional debridement to bleeding, viable wound base.  Wound Progress: This is my initial evaluation of continue monitor the progression of the wound Site healing conversation 7 Dressing: Dry, sterile, compression dressing. Disposition: Patient tolerated procedure well. Patient to return in 1 week for follow-up.  No follow-ups on file.

## 2022-02-18 ENCOUNTER — Encounter (HOSPITAL_COMMUNITY): Payer: Medicare Other

## 2022-02-25 ENCOUNTER — Ambulatory Visit (INDEPENDENT_AMBULATORY_CARE_PROVIDER_SITE_OTHER): Payer: 59 | Admitting: Podiatry

## 2022-02-25 DIAGNOSIS — L97522 Non-pressure chronic ulcer of other part of left foot with fat layer exposed: Secondary | ICD-10-CM | POA: Diagnosis not present

## 2022-02-25 DIAGNOSIS — M216X2 Other acquired deformities of left foot: Secondary | ICD-10-CM | POA: Diagnosis not present

## 2022-03-01 ENCOUNTER — Telehealth: Payer: Self-pay | Admitting: *Deleted

## 2022-03-01 NOTE — Telephone Encounter (Signed)
Patient is calling for status of a prescription that was supposed to be sent to pharmacy, not there, please advise/ send along with something for pain,antibiotic.

## 2022-03-02 ENCOUNTER — Other Ambulatory Visit: Payer: Self-pay | Admitting: Podiatry

## 2022-03-02 DIAGNOSIS — L97522 Non-pressure chronic ulcer of other part of left foot with fat layer exposed: Secondary | ICD-10-CM

## 2022-03-02 MED ORDER — OXYCODONE-ACETAMINOPHEN 5-325 MG PO TABS
1.0000 | ORAL_TABLET | ORAL | 0 refills | Status: DC | PRN
Start: 2022-03-02 — End: 2022-08-19

## 2022-03-02 MED ORDER — DOXYCYCLINE HYCLATE 100 MG PO TABS: 100.0000 mg | ORAL_TABLET | Freq: Two times a day (BID) | ORAL | 0 refills | Status: DC

## 2022-03-02 NOTE — Progress Notes (Signed)
Subjective:  Patient ID: Nathaniel [name], male    DOB: 1960-08-18,  MRN: [id number]  Chief Complaint  Patient presents with   Wound Check    61 y.o. male presents for wound care.  Patient presents with complaint of left submetatarsal 5 ulceration with fat layer exposed.  Patient states the wound looks about the same he has been keeping covered.  He had his vascular study done.  He denies any other acute complaints.  He he is not a diabetic  Review of Systems: Negative except as noted in the HPI. Denies N/V/F/Ch.  Past Medical History:  Diagnosis Date   Arthritis    GERD (gastroesophageal reflux disease)    Hx MRSA infection 2004   in the  foot   Hypertension    Peripheral vascular disease (HCC)    Pneumonia    hx of at age 42     Current Outpatient Medications:    amLODipine (NORVASC) 5 MG tablet, Take 5 mg by mouth daily with supper., Disp: , Rfl:    Ascorbic Acid (VITAMIN C) 1000 MG tablet, Take 1,000 mg by mouth daily., Disp: , Rfl:    aspirin EC 81 MG tablet, Take 1 tablet (81 mg total) by mouth 2 (two) times daily. To be taken after surgery, Disp: 84 tablet, Rfl: 0   cephALEXin (KEFLEX) 500 MG capsule, Take 1 capsule (500 mg total) by mouth 4 (four) times daily., Disp: 40 capsule, Rfl: 0   doxycycline (VIBRA-TABS) 100 MG tablet, Take 1 tablet (100 mg total) by mouth 2 (two) times daily., Disp: 60 tablet, Rfl: 0   furosemide (LASIX) 20 MG tablet, Take 20 mg by mouth daily., Disp: , Rfl:    hydrochlorothiazide (MICROZIDE) 12.5 MG capsule, Take 12.5 mg by mouth daily., Disp: , Rfl:    LORazepam (ATIVAN) 2 MG tablet, Take 2 mg by mouth at bedtime., Disp: , Rfl:    losartan (COZAAR) 50 MG tablet, Take 50 mg by mouth daily., Disp: , Rfl:    magnesium gluconate (MAGONATE) 500 MG tablet, Take 500 mg by mouth daily., Disp: , Rfl:    methocarbamol (ROBAXIN) 500 MG tablet, Take 1 tablet (500 mg total) by mouth 2 (two) times daily as needed., Disp: 20 tablet, Rfl: 0   Omega-3 Fatty Acids  (OMEGA-3 PO), Take 3,000 mg by mouth daily., Disp: , Rfl:    omeprazole (PRILOSEC) 20 MG capsule, Take 20 mg by mouth daily with supper. , Disp: , Rfl:    ondansetron (ZOFRAN) 4 MG tablet, Take 1 tablet (4 mg total) by mouth every 8 (eight) hours as needed for nausea or vomiting., Disp: 40 tablet, Rfl: 0   oxyCODONE-acetaminophen (PERCOCET) 5-325 MG tablet, Take 1-2 tablets by mouth every 8 (eight) hours as needed., Disp: 40 tablet, Rfl: 0   oxyCODONE-acetaminophen (PERCOCET) 5-325 MG tablet, Take 1 tablet by mouth every 4 (four) hours as needed for severe pain., Disp: 30 tablet, Rfl: 0   sildenafil (REVATIO) 20 MG tablet, Take 20 mg by mouth daily as needed (ED)., Disp: , Rfl:    SYMBICORT 160-4.5 MCG/ACT inhaler, Inhale 2 puffs into the lungs 2 (two) times daily as needed for wheezing or shortness of breath., Disp: , Rfl:    traMADol (ULTRAM) 50 MG tablet, Take 50 mg by mouth every 6 (six) hours as needed for moderate pain. , Disp: , Rfl:   Social History   Tobacco Use  Smoking Status Former   Types: E-cigarettes   Quit date: 07/09/2020   Years  since quitting: 1.6  Smokeless Tobacco Never    Allergies  Allergen Reactions   Sulfa Antibiotics Rash   Objective:  There were no vitals filed for this visit. There is no height or weight on file to calculate BMI. Constitutional Well developed. Well nourished.  Vascular Dorsalis pedis pulses faintly palpable bilaterally. Posterior tibial pulses faintly palpable bilaterally. Capillary refill normal to all digits.  No cyanosis or clubbing noted. Pedal hair growth normal.  Neurologic Normal speech. Oriented to person, place, and time. Protective sensation absent  Dermatologic Wound Location: Left submetatarsal 5 with fat layer exposed.  No probing down to bone.  No purulent drainage noted no redness noted. Wound Base: Granular/Healthy Peri-wound: Calloused Exudate: Scant/small amount See below Wound Measurements: -See below   Orthopedic: No pain to palpation either foot.   Radiographs: 3 views of skeletally mature adult left foot: Previous hardware noted with subtalar joint fusion good fusion noted across the STJ joint.  Plantarflexed fifth metatarsal noted.  Midfoot arthritis noted no other bony abnormalities identified. Assessment:   1. Plantar flexed metatarsal, left   2. Chronic foot ulcer with fat layer exposed, left ([place])     Plan:  Patient was evaluated and treated and all questions answered.  Ulcer left submetatarsal 5 with fat layer exposed -Clinically at this time the wound appears to be about the same.  At this time patient will benefit from an MRI evaluation to rule out osteomyelitis of the fifth metatarsal head.  I ultimately discussed with him he will benefit from either met head resection versus floating osteotomy of the fifth based on the MRI findings.  Ultimately patient does need a reduction of pressure to help heal the wound.  I discussed with patient he states understand like to proceed with surgery I discussed my preoperative intra and postoperative plan in extensive detail he states understanding would like to proceed with surgery -We will plan on doing 1/5 metatarsal head resection versus floating osteotomy of the fifth based on MRI findings -MRI was ordered -Informed surgical risk consent was reviewed and read aloud to the patient.  I reviewed the films.  I have discussed my findings with the patient in great detail.  I have discussed all risks including but not limited to infection, stiffness, scarring, limp, disability, deformity, damage to blood vessels and nerves, numbness, poor healing, need for braces, arthritis, chronic pain, amputation, death.  All benefits and realistic expectations discussed in great detail.  I have made no promises as to the outcome.  I have provided realistic expectations.  I have offered the patient a 2nd opinion, which they have declined and assured me they  preferred to proceed despite the risks   Vascular abnormality -ABIs PVRs were reviewed which shows excellent flow to the both lower extremity.  Procedure: Excisional Debridement of Wound Tool: Sharp chisel blade/tissue nipper Rationale: Removal of non-viable soft tissue from the wound to promote healing.  Anesthesia: none Pre-Debridement Wound Measurements: 1 cm x 1 cm x 0.3 cm  Post-Debridement Wound Measurements: 1.1 cm x 1.2 cm x 0.3 cm  Type of Debridement: Sharp Excisional Tissue Removed: Non-viable soft tissue Blood loss: Minimal (<50cc) Depth of Debridement: subcutaneous tissue. Technique: Sharp excisional debridement to bleeding, viable wound base.  Wound Progress: This is my initial evaluation of continue monitor the progression of the wound Site healing conversation 7 Dressing: Dry, sterile, compression dressing. Disposition: Patient tolerated procedure well. Patient to return in 1 week for follow-up.  No follow-ups on file.

## 2022-03-02 NOTE — Telephone Encounter (Signed)
Patient notified

## 2022-03-02 NOTE — Telephone Encounter (Signed)
Patient is also needing something called in for pain,please advise.

## 2022-03-04 ENCOUNTER — Telehealth: Payer: Self-pay | Admitting: *Deleted

## 2022-03-04 NOTE — Telephone Encounter (Signed)
Patient calling for status of MRI, spoke with patient to let him know that the order was placed w/ [facility], someone will be contacting him for scheduling.

## 2022-03-11 ENCOUNTER — Telehealth: Payer: Self-pay | Admitting: Urology

## 2022-03-11 NOTE — Telephone Encounter (Signed)
DOS - 04/05/22  METATARSAL HEAD RESECTION 5TH LEFT --- 28113 VS METATARSAL OSTEOTOMY 5TH LEFT --- 28308  [facility] EFFECTIVE DATE - 02/06/22  PER [employer] WEBSITE FOR CPT CODES 84536 AND 28308 Notification or Prior Authorization is not required for the requested services  Decision ID #:[id number]

## 2022-03-12 ENCOUNTER — Other Ambulatory Visit: Payer: 59

## 2022-03-18 ENCOUNTER — Emergency Department
Admission: EM | Admit: 2022-03-18 | Discharge: 2022-03-18 | Disposition: A | Discharge status: Home / Self Care | Payer: 59 | Attending: Student in an Organized Health Care Education/Training Program | Admitting: Student in an Organized Health Care Education/Training Program

## 2022-03-18 ENCOUNTER — Other Ambulatory Visit: Payer: Self-pay

## 2022-03-18 ENCOUNTER — Other Ambulatory Visit: Payer: Self-pay | Admitting: Podiatry

## 2022-03-18 ENCOUNTER — Encounter: Payer: Self-pay | Admitting: *Deleted

## 2022-03-18 ENCOUNTER — Ambulatory Visit
Admission: RE | Admit: 2022-03-18 | Discharge: 2022-03-18 | Disposition: A | Discharge status: Home / Self Care | Payer: 59 | Source: Ambulatory Visit | Attending: Podiatry | Admitting: Podiatry

## 2022-03-18 ENCOUNTER — Emergency Department: Payer: 59

## 2022-03-18 DIAGNOSIS — X58XXXA Exposure to other specified factors, initial encounter: Secondary | ICD-10-CM | POA: Diagnosis not present

## 2022-03-18 DIAGNOSIS — L97522 Non-pressure chronic ulcer of other part of left foot with fat layer exposed: Secondary | ICD-10-CM

## 2022-03-18 DIAGNOSIS — T185XXA Foreign body in anus and rectum, initial encounter: Secondary | ICD-10-CM | POA: Diagnosis not present

## 2022-03-18 MED ORDER — HYDROMORPHONE HCL 1 MG/ML IJ SOLN
0.5000 mg | INTRAMUSCULAR | Status: DC | PRN
  Administered 2022-03-18: 0.5 mg via INTRAVENOUS
  Filled 2022-03-18: qty 0.5

## 2022-03-18 NOTE — ED Triage Notes (Addendum)
Pt reports he was using a wand massager  and the tip came off and is in his rectum since 1300.    Massager in rectum is plastic per pt.  Pt also reports rectal bleeding.  Pt alert  speech clear.  Denies other sx.  Pt ambulating with a cane.  Pt has a cast shoe on left foot.

## 2022-03-18 NOTE — ED Provider Notes (Signed)
[facility] Provider Note    Event Date/Time   First MD Initiated Contact with Patient 03/18/22 1641     (approximate)   History   Foreign Body   HPI  Nathaniel [name] is a 61 y.o. male no significant past medical history presents to the ER for evaluation of rectal foreign body.  States he was using massage wand around noon and the tip came off and became dislodged in his rectum.  He tried to get it out himself but was unsuccessful.  Is having some pain and discomfort has noted a little bit of bleeding.  Denies any abdominal pain.     Physical Exam   Triage Vital Signs: ED Triage Vitals  Enc Vitals Group     BP 03/18/22 1521 139/75     Pulse Rate 03/18/22 1521 (!) 106     Resp 03/18/22 1521 20     Temp 03/18/22 1521 98.6 F (37 C)     Temp Source 03/18/22 1521 Oral     SpO2 03/18/22 1521 96 %     Weight 03/18/22 1519 244 lb (110.7 kg)     Height 03/18/22 1519 6\' 2"  (1.88 m)     Head Circumference --      Peak Flow --      Pain Score 03/18/22 1519 5     Pain Loc --      Pain Edu? --      Excl. in GC? --     Most recent vital signs: Vitals:   03/18/22 1521 03/18/22 1852  BP: 139/75 (!) 143/95  Pulse: (!) 106 80  Resp: 20 18  Temp: 98.6 F (37 C) 98.2 F (36.8 C)  SpO2: 96% 90%     Constitutional: Alert  Eyes: Conjunctivae are normal.  Head: Atraumatic. Nose: No congestion/rhinnorhea. Mouth/Throat: Mucous membranes are moist.   Neck: Painless ROM.  Cardiovascular:   Good peripheral circulation. Respiratory: Normal respiratory effort.  No retractions.  Gastrointestinal: Soft and nontender.  Plastic 05/18/22 like foreign body in rectum.  Does have some bleeding on DRE but no pain.  No external hemorrhoid. Musculoskeletal:  no deformity Neurologic:  MAE spontaneously. No gross focal neurologic deficits are appreciated.  Skin:  Skin is warm, dry and intact. No rash noted. Psychiatric: Mood and affect are normal. Speech and behavior are  normal.    ED Results / Procedures / Treatments   Labs (all labs ordered are listed, but only abnormal results are displayed) Labs Reviewed - No data to display   EKG     RADIOLOGY Please see ED Course for my review and interpretation.  I personally reviewed all radiographic images ordered to evaluate for the above acute complaints and reviewed radiology reports and findings.  These findings were personally discussed with the patient.  Please see medical record for radiology report.    PROCEDURES:  Critical Care performed: No  .Foreign Body Removal  Date/Time: 03/18/2022 6:58 PM  Performed by: 05/18/2022, MD Authorized by: Willy Eddy, MD  Consent: Verbal consent obtained. Consent given by: patient Body area: rectum Removal mechanism: ring forceps Complexity: simple 1 objects recovered.     MEDICATIONS ORDERED IN ED: Medications  HYDROmorphone (DILAUDID) injection 0.5 mg (0.5 mg Intravenous Given 03/18/22 1745)     IMPRESSION / MDM / ASSESSMENT AND PLAN / ED COURSE  I reviewed the triage vital signs and the nursing notes.  Differential diagnosis includes, but is not limited to, foreign body, constipation, fecal  Action patient presented to the ER for evaluation of rectal foreign body.  X-ray on my review and interpretation shows evidence of rectal foreign body.  Foreign body was removed in its entirety.  Did have some small mount of bleeding on DRE.  His abdominal exam is soft benign.  He is completely pain-free.  He is observed in the ER after and does not have any additional bleeding.  Likely irritated hemorrhoid.  Not consistent with perforation.  Patient does appear stable appropriate for outpatient follow-up.     FINAL CLINICAL IMPRESSION(S) / ED DIAGNOSES   Final diagnoses:  Rectal foreign body, initial encounter     Rx / DC Orders   ED Discharge Orders     None        Note:  This document was  prepared using Dragon voice recognition software and may include unintentional dictation errors.    Willy Eddy, MD 03/18/22 1904

## 2022-03-31 ENCOUNTER — Encounter: Payer: Self-pay | Admitting: Podiatry

## 2022-03-31 ENCOUNTER — Ambulatory Visit (INDEPENDENT_AMBULATORY_CARE_PROVIDER_SITE_OTHER): Payer: 59 | Admitting: Podiatry

## 2022-03-31 DIAGNOSIS — L97522 Non-pressure chronic ulcer of other part of left foot with fat layer exposed: Secondary | ICD-10-CM

## 2022-03-31 NOTE — Progress Notes (Signed)
Patient presents for culturing of wound sub 5th MPJ left for pre-op protocol by the surgical center.  Culture taken and will be sent out today for processing.   Patient and surgical center will be notified of results once final report is complete.

## 2022-04-05 ENCOUNTER — Encounter: Payer: Self-pay | Admitting: Podiatry

## 2022-04-05 ENCOUNTER — Other Ambulatory Visit: Payer: Self-pay | Admitting: Podiatry

## 2022-04-05 DIAGNOSIS — M21542 Acquired clubfoot, left foot: Secondary | ICD-10-CM | POA: Diagnosis not present

## 2022-04-05 LAB — WOUND CULTURE

## 2022-04-05 MED ORDER — OXYCODONE-ACETAMINOPHEN 5-325 MG PO TABS: 1.0000 | ORAL_TABLET | ORAL | 0 refills | Status: DC | PRN

## 2022-04-13 ENCOUNTER — Ambulatory Visit (INDEPENDENT_AMBULATORY_CARE_PROVIDER_SITE_OTHER): Payer: 59

## 2022-04-13 ENCOUNTER — Ambulatory Visit (INDEPENDENT_AMBULATORY_CARE_PROVIDER_SITE_OTHER): Payer: 59 | Admitting: Podiatry

## 2022-04-13 DIAGNOSIS — M216X2 Other acquired deformities of left foot: Secondary | ICD-10-CM

## 2022-04-13 DIAGNOSIS — Z9889 Other specified postprocedural states: Secondary | ICD-10-CM

## 2022-04-13 MED ORDER — OXYCODONE-ACETAMINOPHEN 5-325 MG PO TABS: 1.0000 | ORAL_TABLET | ORAL | 0 refills | Status: DC | PRN

## 2022-04-20 NOTE — Progress Notes (Signed)
Subjective:  Patient ID: Nathaniel [name], male    DOB: [birth year],  MRN: [id number]  Chief Complaint  Patient presents with   Routine Post Op    POV #1 DOS 04/05/2022 LT 5TH METATARSAL OSTEOTOMY VS 5TH METATARSAL HEAD EXCISION    DOS: 04/05/2022 Procedure: Left fifth metatarsal osteotomy  61 y.o. male returns for post-op check.  Patient states he is doing okay minimal pain pain controlled.  Denies any other acute complaints.  No nausea fever chills vomiting  Review of Systems: Negative except as noted in the HPI. Denies N/V/F/Ch.  Past Medical History:  Diagnosis Date   Arthritis    GERD (gastroesophageal reflux disease)    Hx MRSA infection 2004   in the  foot   Hypertension    Peripheral vascular disease (HCC)    Pneumonia    hx of at age 62     Current Outpatient Medications:    oxyCODONE-acetaminophen (PERCOCET) 5-325 MG tablet, Take 1 tablet by mouth every 4 (four) hours as needed for severe pain., Disp: 30 tablet, Rfl: 0   amLODipine (NORVASC) 5 MG tablet, Take 5 mg by mouth daily with supper., Disp: , Rfl:    Ascorbic Acid (VITAMIN C) 1000 MG tablet, Take 1,000 mg by mouth daily., Disp: , Rfl:    aspirin EC 81 MG tablet, Take 1 tablet (81 mg total) by mouth 2 (two) times daily. To be taken after surgery, Disp: 84 tablet, Rfl: 0   cephALEXin (KEFLEX) 500 MG capsule, Take 1 capsule (500 mg total) by mouth 4 (four) times daily., Disp: 40 capsule, Rfl: 0   doxycycline (VIBRA-TABS) 100 MG tablet, Take 1 tablet (100 mg total) by mouth 2 (two) times daily., Disp: 60 tablet, Rfl: 0   furosemide (LASIX) 20 MG tablet, Take 20 mg by mouth daily., Disp: , Rfl:    hydrochlorothiazide (MICROZIDE) 12.5 MG capsule, Take 12.5 mg by mouth daily., Disp: , Rfl:    LORazepam (ATIVAN) 2 MG tablet, Take 2 mg by mouth at bedtime., Disp: , Rfl:    losartan (COZAAR) 50 MG tablet, Take 50 mg by mouth daily., Disp: , Rfl:    magnesium gluconate (MAGONATE) 500 MG tablet, Take 500 mg by mouth daily., Disp:  , Rfl:    methocarbamol (ROBAXIN) 500 MG tablet, Take 1 tablet (500 mg total) by mouth 2 (two) times daily as needed., Disp: 20 tablet, Rfl: 0   Omega-3 Fatty Acids (OMEGA-3 PO), Take 3,000 mg by mouth daily., Disp: , Rfl:    omeprazole (PRILOSEC) 20 MG capsule, Take 20 mg by mouth daily with supper. , Disp: , Rfl:    ondansetron (ZOFRAN) 4 MG tablet, Take 1 tablet (4 mg total) by mouth every 8 (eight) hours as needed for nausea or vomiting., Disp: 40 tablet, Rfl: 0   oxyCODONE-acetaminophen (PERCOCET) 5-325 MG tablet, Take 1-2 tablets by mouth every 8 (eight) hours as needed., Disp: 40 tablet, Rfl: 0   oxyCODONE-acetaminophen (PERCOCET) 5-325 MG tablet, Take 1 tablet by mouth every 4 (four) hours as needed for severe pain., Disp: 30 tablet, Rfl: 0   oxyCODONE-acetaminophen (PERCOCET) 5-325 MG tablet, Take 1 tablet by mouth every 4 (four) hours as needed for severe pain., Disp: 30 tablet, Rfl: 0   sildenafil (REVATIO) 20 MG tablet, Take 20 mg by mouth daily as needed (ED)., Disp: , Rfl:    SYMBICORT 160-4.5 MCG/ACT inhaler, Inhale 2 puffs into the lungs 2 (two) times daily as needed for wheezing or shortness of breath., Disp: ,  Rfl:    traMADol (ULTRAM) 50 MG tablet, Take 50 mg by mouth every 6 (six) hours as needed for moderate pain. , Disp: , Rfl:   Social History   Tobacco Use  Smoking Status Every Day   Types: E-cigarettes   Last attempt to quit: 07/09/2020   Years since quitting: 1.7  Smokeless Tobacco Never    Allergies  Allergen Reactions   Sulfa Antibiotics Rash   Objective:  There were no vitals filed for this visit. There is no height or weight on file to calculate BMI. Constitutional Well developed. Well nourished.  Vascular Foot warm and well perfused. Capillary refill normal to all digits.   Neurologic Normal speech. Oriented to person, place, and time. Epicritic sensation to light touch grossly present bilaterally.  Dermatologic Skin healing well without signs of  infection. Skin edges well coapted without signs of infection.  Orthopedic: Tenderness to palpation noted about the surgical site.   Radiographs: 3 views of skeletally mature adult left 3.  Foot: Previous hardware noted appears to be intact.  Osteotomy noted at the fifth metatarsal reduction of pressure noted. Assessment:   1. Plantar flexed metatarsal, left   2. Status post foot surgery    Plan:  Patient was evaluated and treated and all questions answered.  S/p foot surgery left -Progressing as expected post-operatively. -XR: See above -WB Status: Weightbearing as tolerated in surgical shoe -Sutures: Intact.  No clinical signs of dehiscence is noted no complication noted. -Medications: Percocet was sent to the pharmacy -Foot redressed.  No follow-ups on file.

## 2022-04-27 ENCOUNTER — Ambulatory Visit (INDEPENDENT_AMBULATORY_CARE_PROVIDER_SITE_OTHER): Payer: 59 | Admitting: Podiatry

## 2022-04-27 DIAGNOSIS — Z9889 Other specified postprocedural states: Secondary | ICD-10-CM

## 2022-04-27 DIAGNOSIS — M216X2 Other acquired deformities of left foot: Secondary | ICD-10-CM

## 2022-05-04 NOTE — Progress Notes (Signed)
Subjective:  Patient ID: Nathaniel [name], male    DOB: 05-03-1961,  MRN: [id number]  Chief Complaint  Patient presents with   Routine Post Op    POV #2 DOS 04/05/2022 LT 5TH METATARSAL OSTEOTOMY VS 5TH METATARSAL HEAD EXCISION    DOS: 04/05/2022 Procedure: Left fifth metatarsal osteotomy  61 y.o. male returns for post-op check.  Patient states he is doing okay minimal pain pain controlled.  Denies any other acute complaints.  No nausea fever chills vomiting  Review of Systems: Negative except as noted in the HPI. Denies N/V/F/Ch.  Past Medical History:  Diagnosis Date   Arthritis    GERD (gastroesophageal reflux disease)    Hx MRSA infection 2004   in the  foot   Hypertension    Peripheral vascular disease (HCC)    Pneumonia    hx of at age 46     Current Outpatient Medications:    amLODipine (NORVASC) 5 MG tablet, Take 5 mg by mouth daily with supper., Disp: , Rfl:    Ascorbic Acid (VITAMIN C) 1000 MG tablet, Take 1,000 mg by mouth daily., Disp: , Rfl:    aspirin EC 81 MG tablet, Take 1 tablet (81 mg total) by mouth 2 (two) times daily. To be taken after surgery, Disp: 84 tablet, Rfl: 0   cephALEXin (KEFLEX) 500 MG capsule, Take 1 capsule (500 mg total) by mouth 4 (four) times daily., Disp: 40 capsule, Rfl: 0   doxycycline (VIBRA-TABS) 100 MG tablet, Take 1 tablet (100 mg total) by mouth 2 (two) times daily., Disp: 60 tablet, Rfl: 0   furosemide (LASIX) 20 MG tablet, Take 20 mg by mouth daily., Disp: , Rfl:    hydrochlorothiazide (MICROZIDE) 12.5 MG capsule, Take 12.5 mg by mouth daily., Disp: , Rfl:    LORazepam (ATIVAN) 2 MG tablet, Take 2 mg by mouth at bedtime., Disp: , Rfl:    losartan (COZAAR) 50 MG tablet, Take 50 mg by mouth daily., Disp: , Rfl:    magnesium gluconate (MAGONATE) 500 MG tablet, Take 500 mg by mouth daily., Disp: , Rfl:    methocarbamol (ROBAXIN) 500 MG tablet, Take 1 tablet (500 mg total) by mouth 2 (two) times daily as needed., Disp: 20 tablet, Rfl: 0    Omega-3 Fatty Acids (OMEGA-3 PO), Take 3,000 mg by mouth daily., Disp: , Rfl:    omeprazole (PRILOSEC) 20 MG capsule, Take 20 mg by mouth daily with supper. , Disp: , Rfl:    ondansetron (ZOFRAN) 4 MG tablet, Take 1 tablet (4 mg total) by mouth every 8 (eight) hours as needed for nausea or vomiting., Disp: 40 tablet, Rfl: 0   oxyCODONE-acetaminophen (PERCOCET) 5-325 MG tablet, Take 1-2 tablets by mouth every 8 (eight) hours as needed., Disp: 40 tablet, Rfl: 0   oxyCODONE-acetaminophen (PERCOCET) 5-325 MG tablet, Take 1 tablet by mouth every 4 (four) hours as needed for severe pain., Disp: 30 tablet, Rfl: 0   oxyCODONE-acetaminophen (PERCOCET) 5-325 MG tablet, Take 1 tablet by mouth every 4 (four) hours as needed for severe pain., Disp: 30 tablet, Rfl: 0   oxyCODONE-acetaminophen (PERCOCET) 5-325 MG tablet, Take 1 tablet by mouth every 4 (four) hours as needed for severe pain., Disp: 30 tablet, Rfl: 0   sildenafil (REVATIO) 20 MG tablet, Take 20 mg by mouth daily as needed (ED)., Disp: , Rfl:    SYMBICORT 160-4.5 MCG/ACT inhaler, Inhale 2 puffs into the lungs 2 (two) times daily as needed for wheezing or shortness of breath., Disp: ,  Rfl:    traMADol (ULTRAM) 50 MG tablet, Take 50 mg by mouth every 6 (six) hours as needed for moderate pain. , Disp: , Rfl:   Social History   Tobacco Use  Smoking Status Every Day   Types: E-cigarettes   Last attempt to quit: 07/09/2020   Years since quitting: 1.8  Smokeless Tobacco Never    Allergies  Allergen Reactions   Sulfa Antibiotics Rash   Objective:  There were no vitals filed for this visit. There is no height or weight on file to calculate BMI. Constitutional Well developed. Well nourished.  Vascular Foot warm and well perfused. Capillary refill normal to all digits.   Neurologic Normal speech. Oriented to person, place, and time. Epicritic sensation to light touch grossly present bilaterally.  Dermatologic Skin completely epithelialized  reduction of pressure noted to left submetatarsal 5  Orthopedic: No further tenderness to palpation noted about the surgical site.   Radiographs: 3 views of skeletally mature adult left 3.  Foot: Previous hardware noted appears to be intact.  Osteotomy noted at the fifth metatarsal reduction of pressure noted. Assessment:   1. Plantar flexed metatarsal, left   2. Status post foot surgery     [name]:  Patient was evaluated and treated and all questions answered.  S/p foot surgery left -Clinic healed and patient is officially discharged from my care.  Patient states his pain has completely resolved.  At this time I discussed prevention technique and offloading.  If any foot and ankle issues were in the future of asked him to come back and see me he states understanding.  No follow-ups on file.

## 2022-05-13 ENCOUNTER — Telehealth: Payer: Self-pay | Admitting: Podiatry

## 2022-05-13 MED ORDER — OXYCODONE-ACETAMINOPHEN 5-325 MG PO TABS: 1.0000 | ORAL_TABLET | ORAL | 0 refills | Status: DC | PRN

## 2022-05-13 NOTE — Telephone Encounter (Signed)
Patient called LM on after hours VM that he thinks he needs a couple more days of the antibiotics and he would also like a couple more codeine pain medication. Patient uses [facility].

## 2022-05-25 ENCOUNTER — Ambulatory Visit (INDEPENDENT_AMBULATORY_CARE_PROVIDER_SITE_OTHER): Payer: 59 | Admitting: Podiatry

## 2022-05-25 ENCOUNTER — Ambulatory Visit (INDEPENDENT_AMBULATORY_CARE_PROVIDER_SITE_OTHER): Payer: 59

## 2022-05-25 DIAGNOSIS — Z9889 Other specified postprocedural states: Secondary | ICD-10-CM

## 2022-05-25 DIAGNOSIS — M216X2 Other acquired deformities of left foot: Secondary | ICD-10-CM

## 2022-05-25 MED ORDER — OXYCODONE-ACETAMINOPHEN 5-325 MG PO TABS: 1.0000 | ORAL_TABLET | ORAL | 0 refills | Status: DC | PRN

## 2022-05-25 NOTE — Progress Notes (Signed)
Subjective:  Patient ID: Nathaniel [name], male    DOB: May 14, 1961,  MRN: [id number]  No chief complaint on file.   DOS: 04/05/2022 Procedure: Left fifth metatarsal osteotomy  61 y.o. male returns for post-op check.  Patient states he is doing okay minimal pain pain controlled.  Denies any other acute complaints.  No nausea fever chills vomiting  Review of Systems: Negative except as noted in the HPI. Denies N/V/F/Ch.  Past Medical History:  Diagnosis Date   Arthritis    GERD (gastroesophageal reflux disease)    Hx MRSA infection 2004   in the  foot   Hypertension    Peripheral vascular disease (HCC)    Pneumonia    hx of at age 53     Current Outpatient Medications:    oxyCODONE-acetaminophen (PERCOCET) 5-325 MG tablet, Take 1 tablet by mouth every 4 (four) hours as needed for severe pain., Disp: 30 tablet, Rfl: 0   amLODipine (NORVASC) 5 MG tablet, Take 5 mg by mouth daily with supper., Disp: , Rfl:    Ascorbic Acid (VITAMIN C) 1000 MG tablet, Take 1,000 mg by mouth daily., Disp: , Rfl:    aspirin EC 81 MG tablet, Take 1 tablet (81 mg total) by mouth 2 (two) times daily. To be taken after surgery, Disp: 84 tablet, Rfl: 0   cephALEXin (KEFLEX) 500 MG capsule, Take 1 capsule (500 mg total) by mouth 4 (four) times daily., Disp: 40 capsule, Rfl: 0   doxycycline (VIBRA-TABS) 100 MG tablet, Take 1 tablet (100 mg total) by mouth 2 (two) times daily., Disp: 60 tablet, Rfl: 0   furosemide (LASIX) 20 MG tablet, Take 20 mg by mouth daily., Disp: , Rfl:    hydrochlorothiazide (MICROZIDE) 12.5 MG capsule, Take 12.5 mg by mouth daily., Disp: , Rfl:    LORazepam (ATIVAN) 2 MG tablet, Take 2 mg by mouth at bedtime., Disp: , Rfl:    losartan (COZAAR) 50 MG tablet, Take 50 mg by mouth daily., Disp: , Rfl:    magnesium gluconate (MAGONATE) 500 MG tablet, Take 500 mg by mouth daily., Disp: , Rfl:    methocarbamol (ROBAXIN) 500 MG tablet, Take 1 tablet (500 mg total) by mouth 2 (two) times daily as  needed., Disp: 20 tablet, Rfl: 0   Omega-3 Fatty Acids (OMEGA-3 PO), Take 3,000 mg by mouth daily., Disp: , Rfl:    omeprazole (PRILOSEC) 20 MG capsule, Take 20 mg by mouth daily with supper. , Disp: , Rfl:    ondansetron (ZOFRAN) 4 MG tablet, Take 1 tablet (4 mg total) by mouth every 8 (eight) hours as needed for nausea or vomiting., Disp: 40 tablet, Rfl: 0   oxyCODONE-acetaminophen (PERCOCET) 5-325 MG tablet, Take 1-2 tablets by mouth every 8 (eight) hours as needed., Disp: 40 tablet, Rfl: 0   oxyCODONE-acetaminophen (PERCOCET) 5-325 MG tablet, Take 1 tablet by mouth every 4 (four) hours as needed for severe pain., Disp: 30 tablet, Rfl: 0   oxyCODONE-acetaminophen (PERCOCET) 5-325 MG tablet, Take 1 tablet by mouth every 4 (four) hours as needed for severe pain., Disp: 30 tablet, Rfl: 0   oxyCODONE-acetaminophen (PERCOCET) 5-325 MG tablet, Take 1 tablet by mouth every 4 (four) hours as needed for severe pain., Disp: 30 tablet, Rfl: 0   oxyCODONE-acetaminophen (PERCOCET) 5-325 MG tablet, Take 1-2 tablets by mouth every 4 (four) hours as needed for severe pain., Disp: 30 tablet, Rfl: 0   sildenafil (REVATIO) 20 MG tablet, Take 20 mg by mouth daily as needed (ED)., Disp: ,  Rfl:    SYMBICORT 160-4.5 MCG/ACT inhaler, Inhale 2 puffs into the lungs 2 (two) times daily as needed for wheezing or shortness of breath., Disp: , Rfl:    traMADol (ULTRAM) 50 MG tablet, Take 50 mg by mouth every 6 (six) hours as needed for moderate pain. , Disp: , Rfl:   Social History   Tobacco Use  Smoking Status Every Day   Types: E-cigarettes   Last attempt to quit: 07/09/2020   Years since quitting: 1.8  Smokeless Tobacco Never    Allergies  Allergen Reactions   Sulfa Antibiotics Rash   Objective:  There were no vitals filed for this visit. There is no height or weight on file to calculate BMI. Constitutional Well developed. Well nourished.  Vascular Foot warm and well perfused. Capillary refill normal to all  digits.   Neurologic Normal speech. Oriented to person, place, and time. Epicritic sensation to light touch grossly present bilaterally.  Dermatologic Skin completely epithelialized reduction of pressure noted to left submetatarsal 5  Orthopedic: No further tenderness to palpation noted about the surgical site.   Radiographs: 3 views of skeletally mature adult left 3.  Foot: Previous hardware noted appears to be intact.  Osteotomy noted at the fifth metatarsal reduction of pressure noted. Assessment:   1. Plantar flexed metatarsal, left     Plan:  Patient was evaluated and treated and all questions answered.  S/p foot surgery left -Clinic healed and patient is officially discharged from my care.  Patient states his pain has completely resolved.  At this time I discussed prevention technique and offloading.  If any foot and ankle issues were in the future of asked him to come back and see me he states understanding.  No follow-ups on file.

## 2022-08-18 NOTE — Progress Notes (Unsigned)
   There were no vitals taken for this visit.   Subjective:    Patient ID: [name], male    DOB: July 24, 1961, 62 y.o.   MRN: [id number]  HPI: Becket Wecker is a 62 y.o. male  No chief complaint on file.  Establish care: his last physical was ***.  Medical history includes ***.  Family history includes ***.  Health Maintenance ***.   Relevant past medical, surgical, family and social history reviewed and updated as indicated. Interim medical history since our last visit reviewed. Allergies and medications reviewed and updated.  Review of Systems  Constitutional: Negative for fever or weight change.  Respiratory: Negative for cough and shortness of breath.   Cardiovascular: Negative for chest pain or palpitations.  Gastrointestinal: Negative for abdominal pain, no bowel changes.  Musculoskeletal: Negative for gait problem or joint swelling.  Skin: Negative for rash.  Neurological: Negative for dizziness or headache.  No other specific complaints in a complete review of systems (except as listed in HPI above).      Objective:    There were no vitals taken for this visit.  Wt Readings from Last 3 Encounters:  03/18/22 244 lb (110.7 kg)  10/16/20 237 lb (107.5 kg)  09/16/20 262 lb (118.8 kg)    Physical Exam  Constitutional: Patient appears well-developed and well-nourished. Obese *** No distress.  HEENT: head atraumatic, normocephalic, pupils equal and reactive to light, ears ***, neck supple, throat within normal limits Cardiovascular: Normal rate, regular rhythm and normal heart sounds.  No murmur heard. No BLE edema. Pulmonary/Chest: Effort normal and breath sounds normal. No respiratory distress. Abdominal: Soft.  There is no tenderness. Psychiatric: Patient has a normal mood and affect. behavior is normal. Judgment and thought content normal.  Results for orders placed or performed in visit on 03/31/22  WOUND CULTURE   Specimen: Foot, Left; Wound   Wound Culture and sens   Result Value Ref Range   Gram Stain Result Final report    Organism ID, Bacteria Comment    Organism ID, Bacteria Comment    Aerobic Bacterial Culture Final report (A)    Organism ID, Bacteria Comment (A)    Organism ID, Bacteria Routine flora    Antimicrobial Susceptibility Comment       Assessment & Plan:   Problem List Items Addressed This Visit   None    Follow up plan: No follow-ups on file.

## 2022-08-19 ENCOUNTER — Ambulatory Visit (INDEPENDENT_AMBULATORY_CARE_PROVIDER_SITE_OTHER): Payer: 59 | Admitting: Nurse Practitioner

## 2022-08-19 ENCOUNTER — Other Ambulatory Visit: Payer: Self-pay

## 2022-08-19 ENCOUNTER — Encounter: Payer: Self-pay | Admitting: Nurse Practitioner

## 2022-08-19 VITALS — BP 130/78 | HR 100 | Temp 98.0°F | Resp 18 | Wt 251.9 lb

## 2022-08-19 DIAGNOSIS — K219 Gastro-esophageal reflux disease without esophagitis: Secondary | ICD-10-CM

## 2022-08-19 DIAGNOSIS — R21 Rash and other nonspecific skin eruption: Secondary | ICD-10-CM

## 2022-08-19 DIAGNOSIS — Z114 Encounter for screening for human immunodeficiency virus [HIV]: Secondary | ICD-10-CM

## 2022-08-19 DIAGNOSIS — Z131 Encounter for screening for diabetes mellitus: Secondary | ICD-10-CM

## 2022-08-19 DIAGNOSIS — F1020 Alcohol dependence, uncomplicated: Secondary | ICD-10-CM

## 2022-08-19 DIAGNOSIS — J449 Chronic obstructive pulmonary disease, unspecified: Secondary | ICD-10-CM

## 2022-08-19 DIAGNOSIS — Z1159 Encounter for screening for other viral diseases: Secondary | ICD-10-CM | POA: Diagnosis not present

## 2022-08-19 DIAGNOSIS — F331 Major depressive disorder, recurrent, moderate: Secondary | ICD-10-CM | POA: Insufficient documentation

## 2022-08-19 DIAGNOSIS — Z720 Tobacco use: Secondary | ICD-10-CM

## 2022-08-19 DIAGNOSIS — L97522 Non-pressure chronic ulcer of other part of left foot with fat layer exposed: Secondary | ICD-10-CM

## 2022-08-19 DIAGNOSIS — Z23 Encounter for immunization: Secondary | ICD-10-CM | POA: Diagnosis not present

## 2022-08-19 DIAGNOSIS — I10 Essential (primary) hypertension: Secondary | ICD-10-CM

## 2022-08-19 DIAGNOSIS — F419 Anxiety disorder, unspecified: Secondary | ICD-10-CM

## 2022-08-19 DIAGNOSIS — Z13220 Encounter for screening for lipoid disorders: Secondary | ICD-10-CM

## 2022-08-19 MED ORDER — ZOSTER VAC RECOMB ADJUVANTED 50 MCG/0.5ML IM SUSR
INTRAMUSCULAR | 1 refills | Status: DC
Start: 2022-08-19 — End: 2022-09-17

## 2022-08-19 MED ORDER — TRIAMCINOLONE ACETONIDE 0.5 % EX OINT: 1.0000 | TOPICAL_OINTMENT | Freq: Two times a day (BID) | CUTANEOUS | 0 refills | Status: DC

## 2022-08-19 MED ORDER — ESCITALOPRAM OXALATE 10 MG PO TABS: 10.0000 mg | ORAL_TABLET | Freq: Every day | ORAL | 0 refills | Status: DC

## 2022-08-19 NOTE — Assessment & Plan Note (Signed)
Start lexapro 10 mg daily, follow up in four weeks

## 2022-08-19 NOTE — Assessment & Plan Note (Signed)
Avoid food triggers

## 2022-08-19 NOTE — Assessment & Plan Note (Signed)
-   Consider smoking cessation.

## 2022-08-19 NOTE — Assessment & Plan Note (Signed)
Start lexapro 10 mg daily, follow up in four weeks 

## 2022-08-19 NOTE — Assessment & Plan Note (Signed)
Continue attending AA zoom meetings.

## 2022-08-19 NOTE — Assessment & Plan Note (Signed)
Continue using symbicort

## 2022-08-19 NOTE — Assessment & Plan Note (Signed)
Continue taking hydrochlorothiazide 12.5 mg daily, amlodipine 5 mg daily and losartan 50 mg daily

## 2022-08-20 LAB — HEPATITIS C ANTIBODY: Hepatitis C Ab: NONREACTIVE

## 2022-08-20 LAB — COMPLETE METABOLIC PANEL WITH GFR
AG Ratio: 1.5 (calc) (ref 1.0–2.5)
ALT: 46 U/L (ref 9–46)
AST: 90 U/L — ABNORMAL HIGH (ref 10–35)
Albumin: 4.4 g/dL (ref 3.6–5.1)
Alkaline phosphatase (APISO): 128 U/L (ref 35–144)
BUN: 16 mg/dL (ref 7–25)
CO2: 28 mmol/L (ref 20–32)
Calcium: 9.5 mg/dL (ref 8.6–10.3)
Chloride: 96 mmol/L — ABNORMAL LOW (ref 98–110)
Creat: 0.9 mg/dL (ref 0.70–1.30)
Globulin: 3 g/dL (calc) (ref 1.9–3.7)
Glucose, Bld: 98 mg/dL (ref 65–99)
Potassium: 4 mmol/L (ref 3.5–5.3)
Sodium: 138 mmol/L (ref 135–146)
Total Bilirubin: 0.6 mg/dL (ref 0.2–1.2)
Total Protein: 7.4 g/dL (ref 6.1–8.1)
eGFR: 98 mL/min/{1.73_m2} (ref >=60)

## 2022-08-20 LAB — CBC WITH DIFFERENTIAL/PLATELET
Absolute Monocytes: 534 cells/uL (ref 200–950)
Basophils Absolute: 68 cells/uL (ref 0–200)
Basophils Relative: 2 %
Eosinophils Absolute: 68 cells/uL (ref 15–500)
Eosinophils Relative: 2 %
HCT: 43.1 % (ref 38.5–50.0)
Hemoglobin: 15.1 g/dL (ref 13.2–17.1)
Lymphs Abs: 972 cells/uL (ref 850–3900)
MCH: 33 pg (ref 27.0–33.0)
MCHC: 35 g/dL (ref 32.0–36.0)
MCV: 94.3 fL (ref 80.0–100.0)
MPV: 9.7 fL (ref 7.5–12.5)
Monocytes Relative: 15.7 %
Neutro Abs: 1758 cells/uL (ref 1500–7800)
Neutrophils Relative %: 51.7 %
Platelets: 195 10*3/uL (ref 140–400)
RBC: 4.57 10*6/uL (ref 4.20–5.80)
RDW: 14.6 % (ref 11.0–15.0)
Total Lymphocyte: 28.6 %
WBC: 3.4 10*3/uL — ABNORMAL LOW (ref 3.8–10.8)

## 2022-08-20 LAB — LIPID PANEL
Cholesterol: 223 mg/dL — ABNORMAL HIGH (ref <200)
HDL: 147 mg/dL (ref >=40)
LDL Cholesterol (Calc): 63 mg/dL (calc)
Non-HDL Cholesterol (Calc): 76 mg/dL (calc) (ref <130)
Total CHOL/HDL Ratio: 1.5 (calc) (ref <5.0)
Triglycerides: 45 mg/dL (ref <150)

## 2022-08-20 LAB — HEMOGLOBIN A1C
Hgb A1c MFr Bld: 5.6 % of total Hgb (ref <5.7)
Mean Plasma Glucose: 114 mg/dL
eAG (mmol/L): 6.3 mmol/L

## 2022-08-20 LAB — HIV ANTIBODY (ROUTINE TESTING W REFLEX): HIV 1&2 Ab, 4th Generation: NONREACTIVE

## 2022-08-23 ENCOUNTER — Ambulatory Visit: Payer: Self-pay | Admitting: Nurse Practitioner

## 2022-08-26 ENCOUNTER — Ambulatory Visit: Payer: Self-pay | Admitting: *Deleted

## 2022-08-26 NOTE — Telephone Encounter (Signed)
Pt given lab results per notes of J.Reece Packer, FNP from 08/24/22 on 08/26/22. Pt verbalized understanding and recommended to decrease alcohol intake.

## 2022-09-10 ENCOUNTER — Other Ambulatory Visit: Payer: Self-pay | Admitting: Nurse Practitioner

## 2022-09-10 DIAGNOSIS — R21 Rash and other nonspecific skin eruption: Secondary | ICD-10-CM

## 2022-09-10 DIAGNOSIS — F331 Major depressive disorder, recurrent, moderate: Secondary | ICD-10-CM

## 2022-09-10 DIAGNOSIS — F419 Anxiety disorder, unspecified: Secondary | ICD-10-CM

## 2022-09-10 MED ORDER — ESCITALOPRAM OXALATE 10 MG PO TABS: 10.0000 mg | ORAL_TABLET | Freq: Every day | ORAL | 1 refills | Status: DC

## 2022-09-10 NOTE — Telephone Encounter (Signed)
Medication Refill - Medication: escitalopram (LEXAPRO) 10 MG tablet [[id number]]   triamcinolone ointment (KENALOG) 0.5 % [[id number]]   Has the patient contacted their pharmacy? Yes.   (Agent: If no, request that the patient contact the pharmacy for the refill. If patient does not wish to contact the pharmacy document the reason why and proceed with request.) (Agent: If yes, when and what did the pharmacy advise?)  Preferred Pharmacy (with phone number or street name: [place], [place] - [address] [place] [address] [place], [place] [address] Phone: [phone]  Fax: [phone]  Has the patient been seen for an appointment in the last year OR does the patient have an upcoming appointment? Yes.    Agent: Please be advised that RX refills may take up to 3 business days. We ask that you follow-up with your pharmacy.

## 2022-09-10 NOTE — Telephone Encounter (Signed)
Requested medications are due for refill today.  unsure  Requested medications are on the active medications list.  yes  Last refill. 08/19/2022   Future visit scheduled.   yes  Notes to clinic.  Refill not delegated    Requested Prescriptions  Pending Prescriptions Disp Refills   triamcinolone ointment (KENALOG) 0.5 % 30 g 0    Sig: Apply 1 Application topically 2 (two) times daily.     Not Delegated - Dermatology:  Corticosteroids Failed - 09/10/2022 10:09 AM      Failed - This refill cannot be delegated      Passed - Valid encounter within last 12 months    Recent Outpatient Visits           3 weeks ago Primary hypertension   [facility] Bo Merino, FNP       Future Appointments             In 1 week Bo Merino, [facility], [facility]            Signed Prescriptions Disp Refills   escitalopram (LEXAPRO) 10 MG tablet 90 tablet 1    Sig: Take 1 tablet (10 mg total) by mouth daily.     Psychiatry:  Antidepressants - SSRI Passed - 09/10/2022 10:09 AM      Passed - Completed PHQ-2 or PHQ-9 in the last 360 days      Passed - Valid encounter within last 6 months    Recent Outpatient Visits           3 weeks ago Primary hypertension   [facility] Bo Merino, FNP       Future Appointments             In 1 week Reece Packer, Myna Hidalgo, [facility], [facility]

## 2022-09-10 NOTE — Telephone Encounter (Signed)
Requested Prescriptions  Pending Prescriptions Disp Refills   escitalopram (LEXAPRO) 10 MG tablet 90 tablet 1    Sig: Take 1 tablet (10 mg total) by mouth daily.     Psychiatry:  Antidepressants - SSRI Passed - 09/10/2022 10:09 AM      Passed - Completed PHQ-2 or PHQ-9 in the last 360 days      Passed - Valid encounter within last 6 months    Recent Outpatient Visits           3 weeks ago Primary hypertension   [facility] Bo Merino, FNP       Future Appointments             In 1 week Bo Merino, [facility], [facility]             triamcinolone ointment (KENALOG) 0.5 % 30 g 0    Sig: Apply 1 Application topically 2 (two) times daily.     Not Delegated - Dermatology:  Corticosteroids Failed - 09/10/2022 10:09 AM      Failed - This refill cannot be delegated      Passed - Valid encounter within last 12 months    Recent Outpatient Visits           3 weeks ago Primary hypertension   [place], FNP       Future Appointments             In 1 week Reece Packer, Myna Hidalgo, [facility], [facility]

## 2022-09-13 MED ORDER — TRIAMCINOLONE ACETONIDE 0.5 % EX OINT: 1.0000 | TOPICAL_OINTMENT | Freq: Two times a day (BID) | CUTANEOUS | 0 refills | Status: DC

## 2022-09-16 NOTE — Progress Notes (Signed)
BP 128/76   Pulse 93   Temp 98.5 F (36.9 C) (Oral)   Resp 16   Ht 6' 2"$  (1.88 m)   Wt 243 lb 14.4 oz (110.6 kg)   SpO2 98%   BMI 31.31 kg/m    Subjective:    Patient ID: Nathaniel [name] Plan, male    DOB: 03/28/1961, 62 y.o.   MRN: [id number]  HPI: Nathaniel [name] is a 62 y.o. male  Chief Complaint  Patient presents with   Anxiety   Depression/anxiety/alcoholism: Patient was as a new patient visit.  Patient reported that he had been on Wellbutrin time ago.  He reported that it did not really work for him he said that his depression got worse and he tried to kill himself by overdose.  He says he is an alcoholic into a program he says he was doing really well for 40 years.  But reports now he is drinking about several drinks a day.  He can go about 4 days without a drink but then he starts again.  He is currently doing AA meetings daily [facility].  We started patient on Lexapro 10 mg daily.  He is here today for follow-up.  He reports he has been taking the lexapro daily. Patient reports he is doing much better. His PHQ9 and GAD scores have improved but are still elevated.  He says he would like to increase the dose.  Will increase to 20 mg daily.      09/17/2022   10:41 AM 08/19/2022   11:13 AM  Depression screen PHQ 2/9  Decreased Interest 2 1  Down, Depressed, Hopeless 2 2  PHQ - 2 Score 4 3  Altered sleeping 1 2  Tired, decreased energy 1 2  Change in appetite 1 3  Feeling bad or failure about yourself  1 2  Trouble concentrating 1 2  Moving slowly or fidgety/restless 1 0  Suicidal thoughts 0 0  PHQ-9 Score 10 14  Difficult doing work/chores Somewhat difficult Somewhat difficult       09/17/2022   10:44 AM 08/19/2022   11:13 AM  GAD 7 : Generalized Anxiety Score  Nervous, Anxious, on Edge 2 2  Control/stop worrying 2 3  Worry too much - different things 2 3  Trouble relaxing 2 3  Restless 2 1  Easily annoyed or irritable 2 1  Afraid - awful might happen 0 0  Total GAD 7 Score 12  13  Anxiety Difficulty Somewhat difficult Somewhat difficult    Relevant past medical, surgical, family and social history reviewed and updated as indicated. Interim medical history since our last visit reviewed. Allergies and medications reviewed and updated.  Review of Systems  Constitutional: Negative for fever or weight change.  Respiratory: Negative for cough and shortness of breath.   Cardiovascular: Negative for chest pain or palpitations.  Gastrointestinal: Negative for abdominal pain, no bowel changes.  Musculoskeletal: Negative for gait problem or joint swelling.  Skin: Negative for rash.  Neurological: Negative for dizziness or headache.  No other specific complaints in a complete review of systems (except as listed in HPI above).      Objective:    BP 128/76   Pulse 93   Temp 98.5 F (36.9 C) (Oral)   Resp 16   Ht 6' 2"$  (1.88 m)   Wt 243 lb 14.4 oz (110.6 kg)   SpO2 98%   BMI 31.31 kg/m   Wt Readings from Last 3 Encounters:  09/17/22 243 lb  14.4 oz (110.6 kg)  08/19/22 251 lb 14.4 oz (114.3 kg)  03/18/22 244 lb (110.7 kg)    Physical Exam  Constitutional: Patient appears well-developed and well-nourished. Obese  No distress.  HEENT: head atraumatic, normocephalic, pupils equal and reactive to light, neck supple Cardiovascular: Normal rate, regular rhythm and normal heart sounds.  No murmur heard. No BLE edema. Pulmonary/Chest: Effort normal and breath sounds normal. No respiratory distress. Abdominal: Soft.  There is no tenderness. Psychiatric: Patient has a normal mood and affect. behavior is normal. Judgment and thought content normal.  Results for orders placed or performed in visit on 08/19/22  CBC with Differential/Platelet  Result Value Ref Range   WBC 3.4 (L) 3.8 - 10.8 Thousand/uL   RBC 4.57 4.20 - 5.80 Million/uL   Hemoglobin 15.1 13.2 - 17.1 g/dL   HCT 43.1 38.5 - 50.0 %   MCV 94.3 80.0 - 100.0 fL   MCH 33.0 27.0 - 33.0 pg   MCHC 35.0 32.0 -  36.0 g/dL   RDW 14.6 11.0 - 15.0 %   Platelets 195 140 - 400 Thousand/uL   MPV 9.7 7.5 - 12.5 fL   Neutro Abs 1,758 1,500 - 7,800 cells/uL   Lymphs Abs 972 850 - 3,900 cells/uL   Absolute Monocytes 534 200 - 950 cells/uL   Eosinophils Absolute 68 15 - 500 cells/uL   Basophils Absolute 68 0 - 200 cells/uL   Neutrophils Relative % 51.7 %   Total Lymphocyte 28.6 %   Monocytes Relative 15.7 %   Eosinophils Relative 2.0 %   Basophils Relative 2.0 %   Smear Review    COMPLETE METABOLIC PANEL WITH GFR  Result Value Ref Range   Glucose, Bld 98 65 - 99 mg/dL   BUN 16 7 - 25 mg/dL   Creat 0.90 0.70 - 1.30 mg/dL   eGFR 98 > OR = 60 mL/min/1.49m   BUN/Creatinine Ratio SEE NOTE: 6 - 22 (calc)   Sodium 138 135 - 146 mmol/L   Potassium 4.0 3.5 - 5.3 mmol/L   Chloride 96 (L) 98 - 110 mmol/L   CO2 28 20 - 32 mmol/L   Calcium 9.5 8.6 - 10.3 mg/dL   Total Protein 7.4 6.1 - 8.1 g/dL   Albumin 4.4 3.6 - 5.1 g/dL   Globulin 3.0 1.9 - 3.7 g/dL (calc)   AG Ratio 1.5 1.0 - 2.5 (calc)   Total Bilirubin 0.6 0.2 - 1.2 mg/dL   Alkaline phosphatase (APISO) 128 35 - 144 U/L   AST 90 (H) 10 - 35 U/L   ALT 46 9 - 46 U/L  Lipid panel  Result Value Ref Range   Cholesterol 223 (H) <200 mg/dL   HDL 147 > OR = 40 mg/dL   Triglycerides 45 <150 mg/dL   LDL Cholesterol (Calc) 63 mg/dL (calc)   Total CHOL/HDL Ratio 1.5 <5.0 (calc)   Non-HDL Cholesterol (Calc) 76 <130 mg/dL (calc)  Hemoglobin A1c  Result Value Ref Range   Hgb A1c MFr Bld 5.6 <5.7 % of total Hgb   Mean Plasma Glucose 114 mg/dL   eAG (mmol/L) 6.3 mmol/L  Hepatitis C antibody  Result Value Ref Range   Hepatitis C Ab NON-REACTIVE NON-REACTIVE  HIV Antibody (routine testing w rflx)  Result Value Ref Range   HIV 1&2 Ab, 4th Generation NON-REACTIVE NON-REACTIVE      Assessment & Plan:   Problem List Items Addressed This Visit       Other   Anxiety  Anxiety and depression has improved, PHQ9 and GAD scores are still elevated, will  increase Lexapro to 20 mg daily.       Relevant Medications   escitalopram (LEXAPRO) 20 MG tablet   Moderate episode of recurrent major depressive disorder (HCC)    Anxiety and depression has improved, PHQ9 and GAD scores are still elevated, will increase Lexapro to 20 mg daily.       Relevant Medications   escitalopram (LEXAPRO) 20 MG tablet     Follow up plan: Return in about 3 months (around 12/16/2022) for follow up.

## 2022-09-17 ENCOUNTER — Encounter: Payer: Self-pay | Admitting: Nurse Practitioner

## 2022-09-17 ENCOUNTER — Other Ambulatory Visit: Payer: Self-pay

## 2022-09-17 ENCOUNTER — Ambulatory Visit (INDEPENDENT_AMBULATORY_CARE_PROVIDER_SITE_OTHER): Payer: 59 | Admitting: Nurse Practitioner

## 2022-09-17 DIAGNOSIS — F419 Anxiety disorder, unspecified: Secondary | ICD-10-CM | POA: Diagnosis not present

## 2022-09-17 DIAGNOSIS — F331 Major depressive disorder, recurrent, moderate: Secondary | ICD-10-CM | POA: Diagnosis not present

## 2022-09-17 MED ORDER — ESCITALOPRAM OXALATE 20 MG PO TABS: 20.0000 mg | ORAL_TABLET | Freq: Every day | ORAL | 0 refills | Status: DC

## 2022-09-17 NOTE — Assessment & Plan Note (Signed)
Anxiety and depression has improved, PHQ9 and GAD scores are still elevated, will increase Lexapro to 20 mg daily.

## 2022-09-23 ENCOUNTER — Ambulatory Visit: Payer: Self-pay

## 2022-09-23 NOTE — Telephone Encounter (Signed)
Patient called, left VM to return the call to the office to speak with a nurse.    Summary: Double vision   Pt has been experiencing double vision, thinks it could be because of Lexapro. About 2-3 weeks, off and on. Sometimes while he drives. Occurs in the evening. Best contact: [phone]

## 2022-09-23 NOTE — Telephone Encounter (Signed)
Chief Complaint: Double vision since starting lexapro Symptoms: no other symptoms Frequency: 2-3 weeks only at night Pertinent Negatives: Patient denies other symptoms Disposition: []$ ED /[]$ Urgent Care (no appt availability in office) / []$ Appointment(In office/virtual)/ []$  [facility]/ []$ Home Care/ []$ Refused Recommended Disposition /[]$ [place] [facility]/ [x]$  Follow-up with PCP Additional Notes: Patient has noticed since starting on Lexapro that he has double vision when the sun goes down while driving (sees 2 vehicles, 2 signs, etc.) or while sitting on the porch looking up in the sky will see 2 moons. During the day no double vision. He says the lexapro is helping with sleep and depression, so he would like to stay on it if he could. Advised I will send this to Almyra Free and someone will call with her recommendation.    Summary: Double vision   Pt has been experiencing double vision, thinks it could be because of Lexapro. About 2-3 weeks, off and on. Sometimes while he drives. Occurs in the evening. Best contact: [phone]     Reason for Disposition  [1] Caller has NON-URGENT medicine question about med that PCP prescribed AND [2] triager unable to answer question  Answer Assessment - Initial Assessment Questions 1. NAME of MEDICINE: "What medicine(s) are you calling about?"     Lexapro 2. QUESTION: "What is your question?" (e.g., double dose of medicine, side effect)     N/A 3. PRESCRIBER: "Who prescribed the medicine?" Reason: if prescribed by specialist, call should be referred to that group.     Serafina Royals 4. SYMPTOMS: "Do you have any symptoms?" If Yes, ask: "What symptoms are you having?"  "How bad are the symptoms (e.g., mild, moderate, severe)     Double vision only at night when driving or sitting outside in the dark looking in the sky  Protocols used: Medication Question Call-A-AH

## 2022-09-24 NOTE — Telephone Encounter (Signed)
Patient stated he do not want to switch medications. He has had double vision before. If issues continue he will call office back

## 2022-09-24 NOTE — Telephone Encounter (Signed)
Left message for patient to call the office

## 2022-09-24 NOTE — Telephone Encounter (Signed)
FYI

## 2022-11-08 DEATH — deceased

## 2022-12-16 ENCOUNTER — Ambulatory Visit: Payer: 59 | Admitting: Nurse Practitioner
